# Patient Record
Sex: Female | Born: 1980 | Race: White | Hispanic: No | Marital: Single | Smoking: Never smoker
Health system: Southern US, Community
[De-identification: ages and names within clinical notes are randomized; demographics above are authoritative.]

## PROBLEM LIST (undated history)

## (undated) DIAGNOSIS — D279 Benign neoplasm of unspecified ovary: Secondary | ICD-10-CM

## (undated) DIAGNOSIS — Z973 Presence of spectacles and contact lenses: Secondary | ICD-10-CM

## (undated) DIAGNOSIS — N83299 Other ovarian cyst, unspecified side: Secondary | ICD-10-CM

## (undated) HISTORY — PX: WISDOM TOOTH EXTRACTION: SHX21

---

## 2005-04-22 ENCOUNTER — Encounter: Admission: RE | Admit: 2005-04-22 | Discharge: 2005-04-22 | Payer: Self-pay | Admitting: Family Medicine

## 2005-11-29 ENCOUNTER — Other Ambulatory Visit: Admission: RE | Admit: 2005-11-29 | Discharge: 2005-11-29 | Payer: Self-pay | Admitting: Obstetrics and Gynecology

## 2006-12-02 ENCOUNTER — Other Ambulatory Visit: Admission: RE | Admit: 2006-12-02 | Discharge: 2006-12-02 | Payer: Self-pay | Admitting: Obstetrics and Gynecology

## 2007-12-03 ENCOUNTER — Other Ambulatory Visit: Admission: RE | Admit: 2007-12-03 | Discharge: 2007-12-03 | Payer: Self-pay | Admitting: Obstetrics and Gynecology

## 2009-01-10 ENCOUNTER — Other Ambulatory Visit: Admission: RE | Admit: 2009-01-10 | Discharge: 2009-01-10 | Payer: Self-pay | Admitting: Obstetrics and Gynecology

## 2010-03-15 ENCOUNTER — Other Ambulatory Visit: Admission: RE | Admit: 2010-03-15 | Discharge: 2010-03-15 | Payer: Self-pay | Admitting: Obstetrics and Gynecology

## 2010-05-27 NOTE — L&D Delivery Note (Signed)
Delivery Note At 10:07 AM a viable female was delivered via Vaginal, Spontaneous Delivery (Presentation: ; Occiput Anterior).  APGAR: 9, 9; weight 7 lb 9.9 oz (3455 g).   Placenta status: Intact, Spontaneous.  Cord: 3 vessels with the following complications: None.  Cord pH: NA  Anesthesia: Local  Episiotomy: None Lacerations: 2nd degree;Perineal;Labial Suture Repair: 3.0 vicryl Est. Blood Loss (mL): 300 cc  Mom to postpartum.  Baby to nursery-stable.  Shyvonne Chastang J. 02/25/2011, 10:36 AM

## 2010-07-02 LAB — GC/CHLAMYDIA PROBE AMP, GENITAL
Chlamydia: NEGATIVE
Gonorrhea: NEGATIVE

## 2010-07-02 LAB — HIV ANTIBODY (ROUTINE TESTING W REFLEX): HIV: NONREACTIVE

## 2010-07-02 LAB — ABO/RH: RH Type: POSITIVE

## 2010-07-02 LAB — RUBELLA ANTIBODY, IGM: Rubella: IMMUNE

## 2010-07-02 LAB — ANTIBODY SCREEN: Antibody Screen: NEGATIVE

## 2010-09-06 ENCOUNTER — Inpatient Hospital Stay (HOSPITAL_COMMUNITY): Admission: AD | Admit: 2010-09-06 | Payer: Self-pay | Source: Ambulatory Visit | Admitting: Obstetrics and Gynecology

## 2011-01-24 LAB — STREP B DNA PROBE: GBS: NEGATIVE

## 2011-02-07 ENCOUNTER — Other Ambulatory Visit: Payer: Self-pay | Admitting: Obstetrics and Gynecology

## 2011-02-07 DIAGNOSIS — O2690 Pregnancy related conditions, unspecified, unspecified trimester: Secondary | ICD-10-CM

## 2011-02-07 DIAGNOSIS — IMO0002 Reserved for concepts with insufficient information to code with codable children: Secondary | ICD-10-CM

## 2011-02-08 ENCOUNTER — Ambulatory Visit (HOSPITAL_COMMUNITY)
Admission: RE | Admit: 2011-02-08 | Discharge: 2011-02-08 | Disposition: A | Discharge status: Home / Self Care | Payer: BC Managed Care – PPO | Source: Ambulatory Visit | Attending: Obstetrics and Gynecology | Admitting: Obstetrics and Gynecology

## 2011-02-08 ENCOUNTER — Encounter (HOSPITAL_COMMUNITY): Payer: Self-pay

## 2011-02-08 ENCOUNTER — Other Ambulatory Visit: Payer: Self-pay | Admitting: Obstetrics and Gynecology

## 2011-02-08 DIAGNOSIS — IMO0002 Reserved for concepts with insufficient information to code with codable children: Secondary | ICD-10-CM

## 2011-02-08 DIAGNOSIS — O368390 Maternal care for abnormalities of the fetal heart rate or rhythm, unspecified trimester, not applicable or unspecified: Secondary | ICD-10-CM | POA: Insufficient documentation

## 2011-02-08 DIAGNOSIS — Z3689 Encounter for other specified antenatal screening: Secondary | ICD-10-CM | POA: Insufficient documentation

## 2011-02-08 DIAGNOSIS — O2690 Pregnancy related conditions, unspecified, unspecified trimester: Secondary | ICD-10-CM

## 2011-02-08 NOTE — Progress Notes (Signed)
Encounter addended by: Marlana Latus, RN on: 02/08/2011 12:01 PM<BR>     Documentation filed: Chief Complaint Section, Episodes

## 2011-02-08 NOTE — Progress Notes (Signed)
Patient seen for ultrasound only appointment today.  Please see [facility]-OBGYN report for details.  

## 2011-02-19 ENCOUNTER — Telehealth (HOSPITAL_COMMUNITY): Payer: Self-pay | Admitting: *Deleted

## 2011-02-19 NOTE — Telephone Encounter (Signed)
Preadmission screen  

## 2011-02-20 ENCOUNTER — Inpatient Hospital Stay (HOSPITAL_COMMUNITY): Admission: AD | Admit: 2011-02-20 | Payer: Self-pay | Source: Ambulatory Visit | Admitting: Obstetrics and Gynecology

## 2011-02-21 ENCOUNTER — Encounter (HOSPITAL_COMMUNITY): Payer: Self-pay | Admitting: *Deleted

## 2011-02-24 ENCOUNTER — Encounter (HOSPITAL_COMMUNITY): Payer: Self-pay

## 2011-02-24 ENCOUNTER — Inpatient Hospital Stay (HOSPITAL_COMMUNITY)
Admission: RE | Admit: 2011-02-24 | Discharge: 2011-02-27 | DRG: 373 | Disposition: A | Discharge status: Home / Self Care | Payer: BC Managed Care – PPO | Source: Ambulatory Visit | Attending: Obstetrics and Gynecology | Admitting: Obstetrics and Gynecology

## 2011-02-24 ENCOUNTER — Other Ambulatory Visit: Payer: Self-pay | Admitting: Obstetrics and Gynecology

## 2011-02-24 DIAGNOSIS — O480 Post-term pregnancy: Principal | ICD-10-CM | POA: Diagnosis present

## 2011-02-24 LAB — CBC
MCH: 30.8 pg (ref 26.0–34.0)
MCHC: 33.9 g/dL (ref 30.0–36.0)
Platelets: 175 10*3/uL (ref 150–400)

## 2011-02-24 MED ORDER — ONDANSETRON HCL 4 MG/2ML IJ SOLN
4.0000 mg | Freq: Four times a day (QID) | INTRAMUSCULAR | Status: DC | PRN
  Administered 2011-02-25: 4 mg via INTRAVENOUS
  Filled 2011-02-24: qty 2

## 2011-02-24 MED ORDER — OXYTOCIN BOLUS FROM INFUSION
500.0000 mL | Freq: Once | INTRAVENOUS | Status: DC
  Filled 2011-02-24: qty 500
  Filled 2011-02-24: qty 1000

## 2011-02-24 MED ORDER — IBUPROFEN 600 MG PO TABS: 600.0000 mg | ORAL_TABLET | Freq: Four times a day (QID) | ORAL | Status: DC | PRN

## 2011-02-24 MED ORDER — MISOPROSTOL 25 MCG QUARTER TABLET
25.0000 ug | ORAL_TABLET | ORAL | Status: DC | PRN
  Administered 2011-02-24 – 2011-02-25 (×2): 25 ug via VAGINAL
  Filled 2011-02-24 (×2): qty 0.25
  Filled 2011-02-24: qty 1

## 2011-02-24 MED ORDER — ZOLPIDEM TARTRATE 10 MG PO TABS: 10.0000 mg | ORAL_TABLET | Freq: Every evening | ORAL | Status: DC | PRN

## 2011-02-24 MED ORDER — OXYTOCIN 20 UNITS IN LACTATED RINGERS INFUSION - SIMPLE
125.0000 mL/h | Freq: Once | INTRAVENOUS | Status: AC
  Administered 2011-02-25: 999 mL/h via INTRAVENOUS

## 2011-02-24 MED ORDER — ACETAMINOPHEN 325 MG PO TABS: 650.0000 mg | ORAL_TABLET | ORAL | Status: DC | PRN

## 2011-02-24 MED ORDER — TERBUTALINE SULFATE 1 MG/ML IJ SOLN: 0.2500 mg | Freq: Once | INTRAMUSCULAR | Status: AC | PRN

## 2011-02-24 MED ORDER — LACTATED RINGERS IV SOLN: 500.0000 mL | INTRAVENOUS | Status: DC | PRN

## 2011-02-24 MED ORDER — CITRIC ACID-SODIUM CITRATE 334-500 MG/5ML PO SOLN: 30.0000 mL | ORAL | Status: DC | PRN

## 2011-02-24 MED ORDER — OXYCODONE-ACETAMINOPHEN 5-325 MG PO TABS: 2.0000 | ORAL_TABLET | ORAL | Status: DC | PRN

## 2011-02-24 MED ORDER — LIDOCAINE HCL (PF) 1 % IJ SOLN
30.0000 mL | INTRAMUSCULAR | Status: AC | PRN
  Administered 2011-02-25: 30 mL via SUBCUTANEOUS
  Filled 2011-02-24: qty 30

## 2011-02-24 MED ORDER — BUTORPHANOL TARTRATE 2 MG/ML IJ SOLN
1.0000 mg | INTRAMUSCULAR | Status: DC | PRN
  Administered 2011-02-25: 1 mg via INTRAVENOUS
  Filled 2011-02-24: qty 1

## 2011-02-24 MED ORDER — LACTATED RINGERS IV SOLN
INTRAVENOUS | Status: DC
  Administered 2011-02-24: 21:00:00 via INTRAVENOUS

## 2011-02-24 NOTE — Progress Notes (Signed)
Notified MD of pt arrival. Orders received.

## 2011-02-25 ENCOUNTER — Encounter (HOSPITAL_COMMUNITY): Payer: Self-pay

## 2011-02-25 LAB — RPR: RPR Ser Ql: NONREACTIVE

## 2011-02-25 LAB — ABO/RH: ABO/RH(D): O POS

## 2011-02-25 MED ORDER — DIBUCAINE 1 % RE OINT: 1.0000 "application " | TOPICAL_OINTMENT | RECTAL | Status: DC | PRN

## 2011-02-25 MED ORDER — OXYCODONE-ACETAMINOPHEN 5-325 MG PO TABS: 1.0000 | ORAL_TABLET | ORAL | Status: DC | PRN

## 2011-02-25 MED ORDER — SIMETHICONE 80 MG PO CHEW: 80.0000 mg | CHEWABLE_TABLET | ORAL | Status: DC | PRN

## 2011-02-25 MED ORDER — BENZOCAINE-MENTHOL 20-0.5 % EX AERO
1.0000 "application " | INHALATION_SPRAY | CUTANEOUS | Status: DC | PRN
  Administered 2011-02-25: 1 via TOPICAL

## 2011-02-25 MED ORDER — METHYLERGONOVINE MALEATE 0.2 MG PO TABS: 0.2000 mg | ORAL_TABLET | ORAL | Status: DC | PRN

## 2011-02-25 MED ORDER — SENNOSIDES-DOCUSATE SODIUM 8.6-50 MG PO TABS
2.0000 | ORAL_TABLET | Freq: Every day | ORAL | Status: DC
  Administered 2011-02-25 – 2011-02-26 (×2): 2 via ORAL

## 2011-02-25 MED ORDER — FENTANYL 2.5 MCG/ML BUPIVACAINE 1/10 % EPIDURAL INFUSION (WH - ANES): 14.0000 mL/h | INTRAMUSCULAR | Status: DC

## 2011-02-25 MED ORDER — METHYLERGONOVINE MALEATE 0.2 MG/ML IJ SOLN: 0.2000 mg | INTRAMUSCULAR | Status: DC | PRN

## 2011-02-25 MED ORDER — DIPHENHYDRAMINE HCL 25 MG PO CAPS: 25.0000 mg | ORAL_CAPSULE | Freq: Four times a day (QID) | ORAL | Status: DC | PRN

## 2011-02-25 MED ORDER — EPHEDRINE 5 MG/ML INJ
10.0000 mg | INTRAVENOUS | Status: DC | PRN
  Filled 2011-02-25: qty 4

## 2011-02-25 MED ORDER — PRENATAL PLUS 27-1 MG PO TABS
1.0000 | ORAL_TABLET | Freq: Every day | ORAL | Status: DC
  Administered 2011-02-26 – 2011-02-27 (×2): 1 via ORAL
  Filled 2011-02-25 (×2): qty 1

## 2011-02-25 MED ORDER — WITCH HAZEL-GLYCERIN EX PADS: 1.0000 "application " | MEDICATED_PAD | CUTANEOUS | Status: DC | PRN

## 2011-02-25 MED ORDER — PRENATAL PLUS 27-1 MG PO TABS: 1.0000 | ORAL_TABLET | Freq: Every day | ORAL | Status: DC

## 2011-02-25 MED ORDER — PHENYLEPHRINE 40 MCG/ML (10ML) SYRINGE FOR IV PUSH (FOR BLOOD PRESSURE SUPPORT)
80.0000 ug | PREFILLED_SYRINGE | INTRAVENOUS | Status: DC | PRN
  Filled 2011-02-25: qty 5

## 2011-02-25 MED ORDER — LACTATED RINGERS IV SOLN: 500.0000 mL | Freq: Once | INTRAVENOUS | Status: DC

## 2011-02-25 MED ORDER — LANOLIN HYDROUS EX OINT: TOPICAL_OINTMENT | CUTANEOUS | Status: DC | PRN

## 2011-02-25 MED ORDER — DIPHENHYDRAMINE HCL 50 MG/ML IJ SOLN: 12.5000 mg | INTRAMUSCULAR | Status: DC | PRN

## 2011-02-25 MED ORDER — IBUPROFEN 600 MG PO TABS
600.0000 mg | ORAL_TABLET | Freq: Four times a day (QID) | ORAL | Status: DC
  Administered 2011-02-25 – 2011-02-27 (×8): 600 mg via ORAL
  Filled 2011-02-25 (×8): qty 1

## 2011-02-25 MED ORDER — ZOLPIDEM TARTRATE 5 MG PO TABS: 5.0000 mg | ORAL_TABLET | Freq: Every evening | ORAL | Status: DC | PRN

## 2011-02-25 MED ORDER — ONDANSETRON HCL 4 MG PO TABS: 4.0000 mg | ORAL_TABLET | ORAL | Status: DC | PRN

## 2011-02-25 MED ORDER — ONDANSETRON HCL 4 MG/2ML IJ SOLN: 4.0000 mg | INTRAMUSCULAR | Status: DC | PRN

## 2011-02-25 MED ORDER — BENZOCAINE-MENTHOL 20-0.5 % EX AERO
INHALATION_SPRAY | CUTANEOUS | Status: AC
  Administered 2011-02-25: 1 via TOPICAL
  Filled 2011-02-25: qty 56

## 2011-02-25 NOTE — Progress Notes (Signed)
UR chart review completed.  

## 2011-02-25 NOTE — H&P (Signed)
MEEYAH OVITT is a 30 y.o. female presenting for induction due to post dates at 40 wks and 4 days. + FM ROM at 600 am clear fluid... No vaginal bleeding. Ctx rated as 5 out of 10. . @IPILAPH @ OB History    Grav Para Term Preterm Abortions TAB SAB Ect Mult Living   1              Past Medical History  Diagnosis Date  . No pertinent past medical history    Past Surgical History  Procedure Date  . Mouth surgery    Family History: family history includes Cancer in her maternal grandmother; Diabetes in her father; and Hyperlipidemia in her father. Social History:  reports that she has never smoked. She does not have any smokeless tobacco history on file. She reports that she does not drink alcohol or use illicit drugs.  MEds PNV Allergies nkda ... No latex allergy    Dilation: 4 Effacement (%): 80 Station: -2 Exam by:: Chief Technology Officer Blood pressure 116/71, pulse 60, temperature 98 F (36.7 C), temperature source Oral, resp. rate 20, height 5\' 8"  (1.727 m), weight 86.183 kg (190 lb). CV rrr  Lungs clear abd gravid nontender Ext Trace edema Cx. 6/80/-2 fhr baseline 120 good btbv +accels no decels  toco ctx q 2 minutes   Prenatal labs: ABO, Rh: O/Positive/-- (02/06 0000) Antibody: Negative (02/06 0000) Rubella:   RPR: NON REACTIVE (09/30 2035)  HBsAg: Negative (02/06 0000)  HIV: Non-reactive (02/06 0000)  GBS: Negative (08/30 0000)   Assessment/Plan: 40 wks 5 days induction for post dates Anticipate svd    Serine Kea J. 02/25/2011, 8:25 AM

## 2011-02-26 LAB — CBC
MCH: 30.4 pg (ref 26.0–34.0)
MCHC: 33.2 g/dL (ref 30.0–36.0)
Platelets: 165 10*3/uL (ref 150–400)
RBC: 3.59 MIL/uL — ABNORMAL LOW (ref 3.87–5.11)

## 2011-02-26 MED ORDER — INFLUENZA VIRUS VACC SPLIT PF IM SUSP
0.5000 mL | Freq: Once | INTRAMUSCULAR | Status: AC
  Administered 2011-02-26: 0.5 mL via INTRAMUSCULAR
  Filled 2011-02-26: qty 0.5

## 2011-02-26 MED ORDER — TETANUS-DIPHTH-ACELL PERTUSSIS 5-2.5-18.5 LF-MCG/0.5 IM SUSP
0.5000 mL | Freq: Once | INTRAMUSCULAR | Status: AC
  Administered 2011-02-26: 0.5 mL via INTRAMUSCULAR
  Filled 2011-02-26: qty 0.5

## 2011-02-26 NOTE — Progress Notes (Signed)
Post Partum Day 1 Subjective: no complaints, up ad lib, voiding, tolerating PO and + flatus  Objective: Blood pressure 112/78, pulse 62, temperature 97.8 F (36.6 C), temperature source Oral, resp. rate 18, height 5\' 8"  (1.727 m), weight 86.183 kg (190 lb), unknown if currently breastfeeding.  Physical Exam:  General: alert and cooperative Lochia: appropriate Uterine Fundus: firm Incision: na DVT Evaluation: No evidence of DVT seen on physical exam.   Basename 02/26/11 0600 02/24/11 2035  HGB 10.9* 12.2  HCT 32.8* 36.0    Assessment/Plan: Plan for discharge tomorrow   LOS: 2 days   Heliodoro Domagalski J. 02/26/2011, 8:39 AM

## 2011-02-27 MED ORDER — IBUPROFEN 600 MG PO TABS: 600.0000 mg | ORAL_TABLET | Freq: Four times a day (QID) | ORAL | Status: AC | PRN

## 2011-02-27 NOTE — Progress Notes (Signed)
Post Partum Day 2 Subjective: no complaints, up ad lib, voiding, tolerating PO and + flatus  Objective: Blood pressure 99/67, pulse 65, temperature 98 F (36.7 C), temperature source Oral, resp. rate 18, height 5\' 8"  (1.727 m), weight 86.183 kg (190 lb), unknown if currently breastfeeding.  Physical Exam:  General: alert and cooperative Lochia: appropriate Uterine Fundus: firm Incision: na DVT Evaluation: No evidence of DVT seen on physical exam.   Basename 02/26/11 0600 02/24/11 2035  HGB 10.9* 12.2  HCT 32.8* 36.0    Assessment/Plan: Discharge home and Breastfeeding   LOS: 3 days   Georgeann Brinkman J. 02/27/2011, 9:04 AM

## 2011-02-27 NOTE — Discharge Summary (Signed)
Obstetric Discharge Summary Reason for Admission: induction of labor Prenatal Procedures: none Intrapartum Procedures: spontaneous vaginal delivery Postpartum Procedures: none Complications-Operative and Postpartum: none Hemoglobin  Date Value Range Status  02/26/2011 10.9* 12.0-15.0 (g/dL) Final     HCT  Date Value Range Status  02/26/2011 32.8* 36.0-46.0 (%) Final    Discharge Diagnoses: Term Pregnancy-delivered  Discharge Information: Date: 02/27/2011 Activity: pelvic rest Diet: routine Medications: PNV and Ibuprofen Condition: stable Instructions: refer to practice specific booklet Discharge to: home Follow-up Information    Follow up with Jahari Billy J.. Make an appointment in 6 weeks.   Contact information:   [address] [place] [address] [phone]          Newborn Data: Live born female  Birth Weight: 7 lb 9.9 oz (3455 g) APGAR: 9, 9  Home with mother.  Keygan Dumond J. 02/27/2011, 9:07 AM

## 2011-06-06 ENCOUNTER — Other Ambulatory Visit (HOSPITAL_COMMUNITY)
Admission: RE | Admit: 2011-06-06 | Discharge: 2011-06-06 | Disposition: A | Discharge status: Home / Self Care | Payer: BC Managed Care – PPO | Source: Ambulatory Visit | Attending: Obstetrics and Gynecology | Admitting: Obstetrics and Gynecology

## 2011-06-06 ENCOUNTER — Other Ambulatory Visit: Payer: Self-pay | Admitting: Obstetrics and Gynecology

## 2011-06-06 DIAGNOSIS — Z01419 Encounter for gynecological examination (general) (routine) without abnormal findings: Secondary | ICD-10-CM | POA: Insufficient documentation

## 2013-02-14 IMAGING — US US OB COMP +14 WK
2 series · 14 of 28 positions shown · non-contrast
Comparison: none

[Series 1: us ob comp +14 wk · 13 of 58 slices shown (1 of 2)]
[im 3/58]
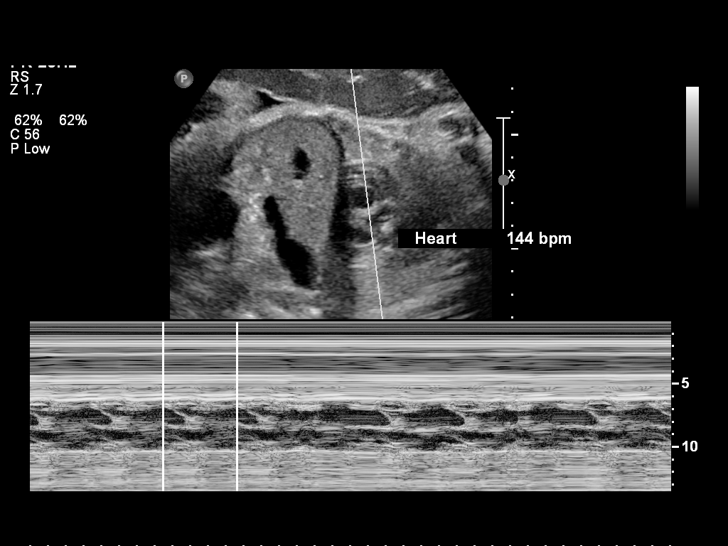
[im 7/58]
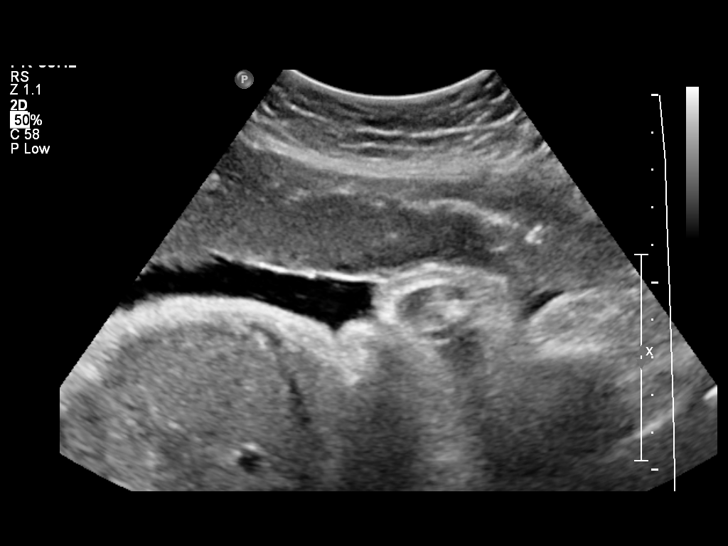
[im 11/58]
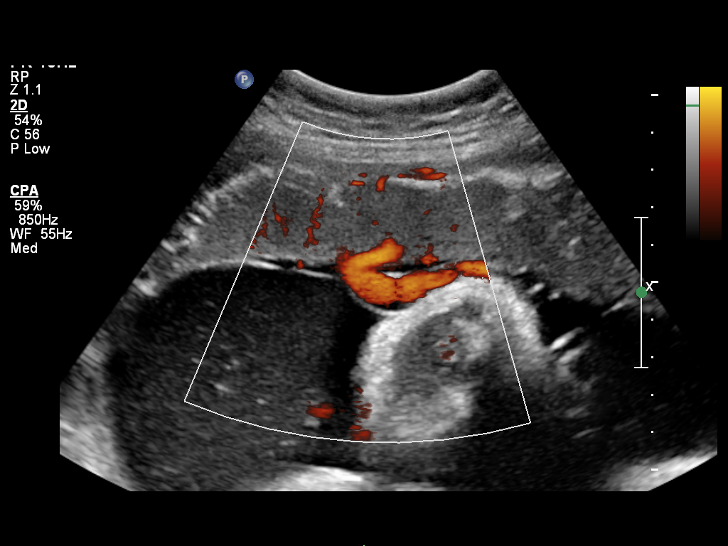
[im 16/58]
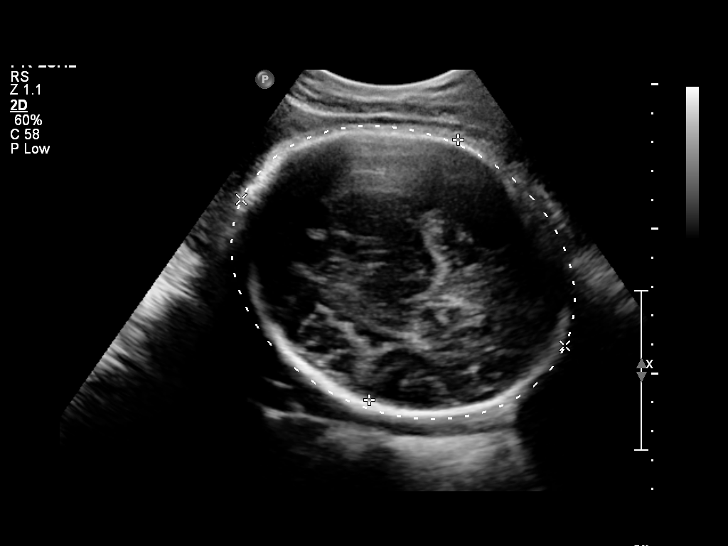
[im 20/58]
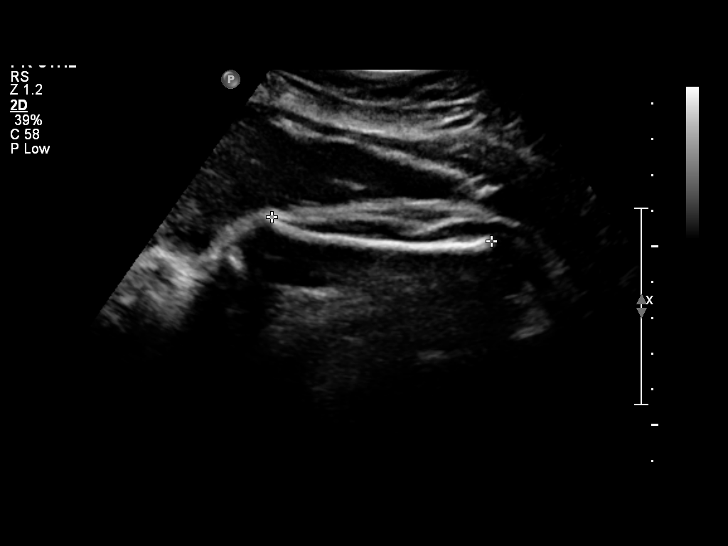
[im 25/58]
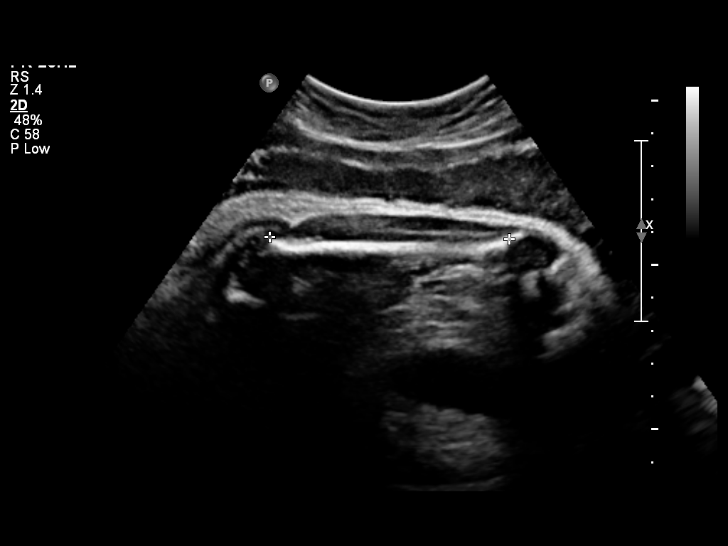
[im 29/58]
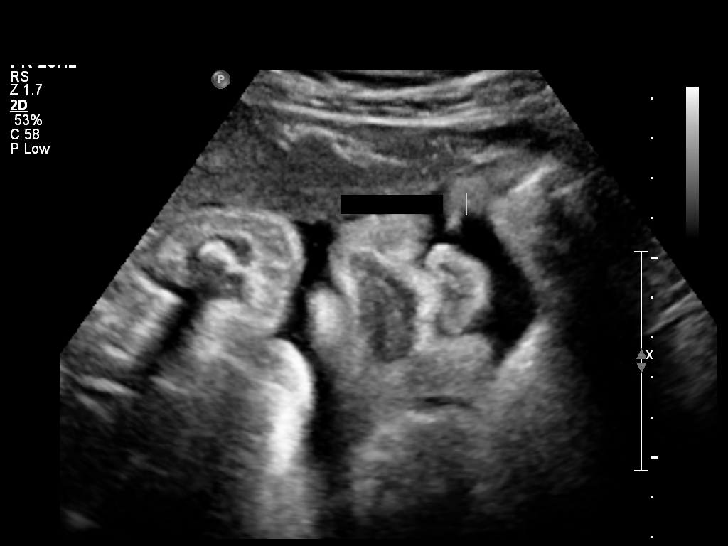
[im 33/58]
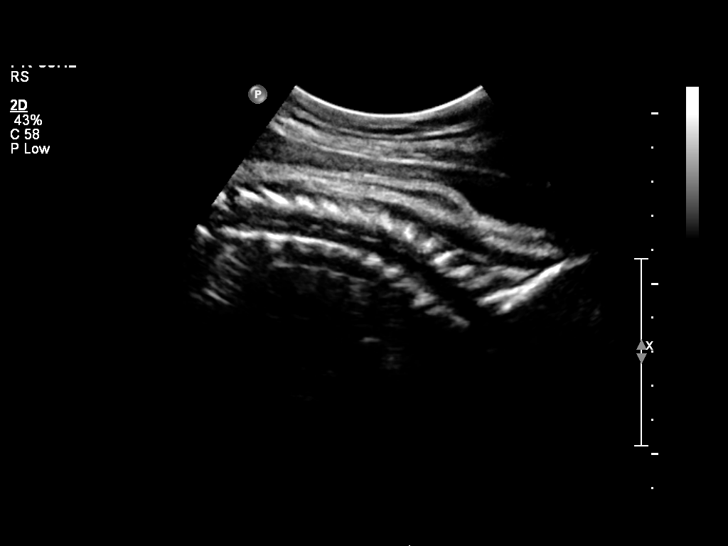
[im 38/58]
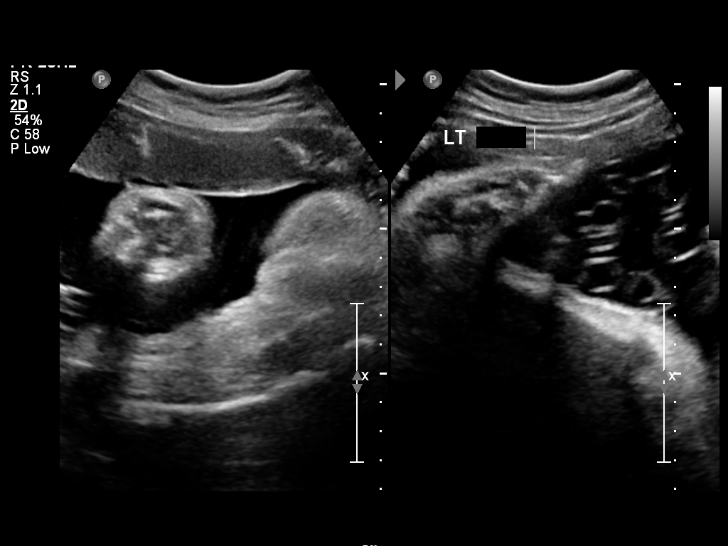
[im 42/58]
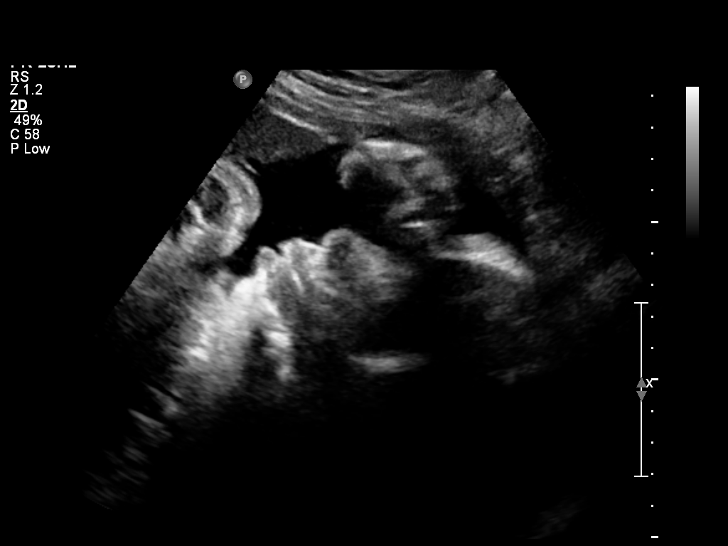
[im 47/58]
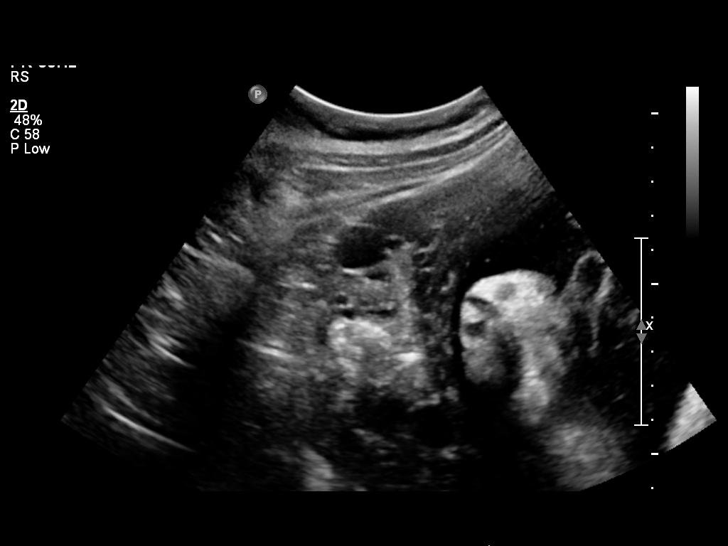
[im 51/58]
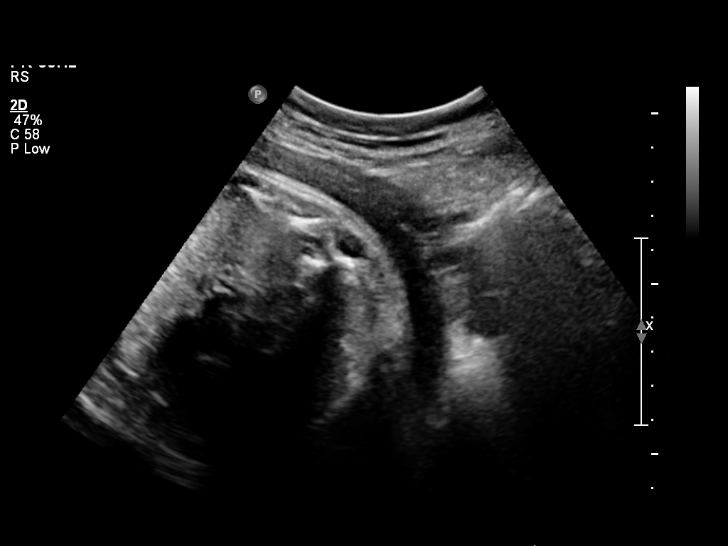
[im 55/58]
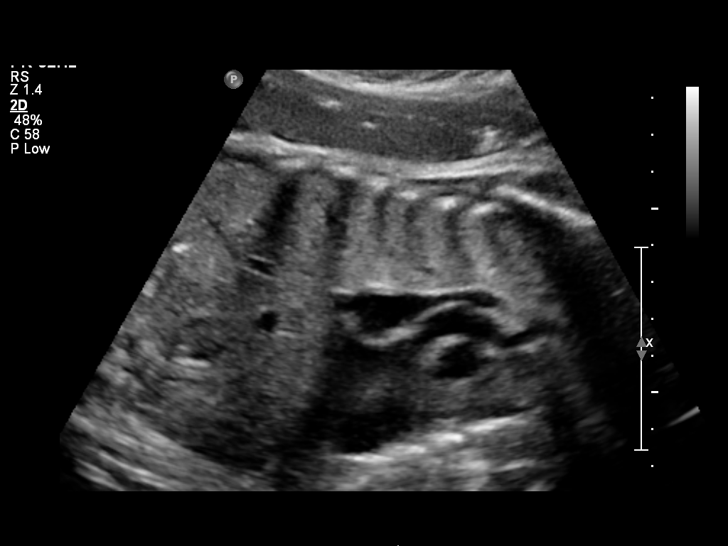

[Series 1: us ob comp +14 wk · 1 of 2 slices shown (2 of 2)]
[im 1/2]
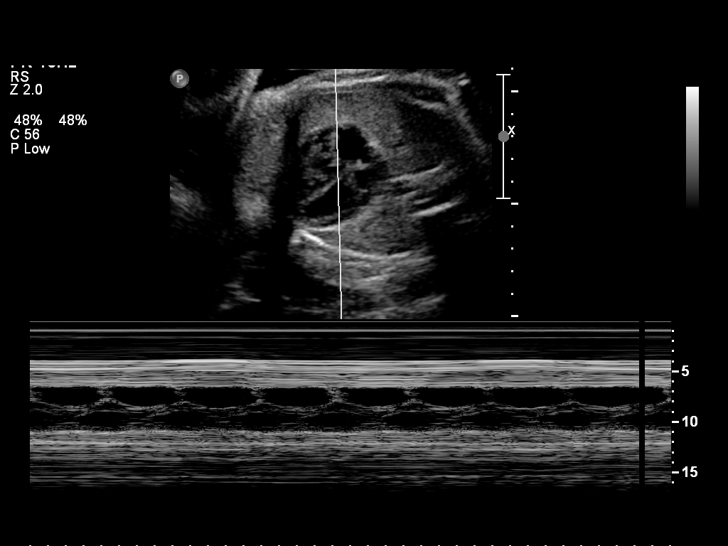

[14 of 28 positions shown; findings below may reference images not displayed]

Canned report from images found in remote index.

Refer to host system for actual result text.

## 2014-03-28 ENCOUNTER — Encounter (HOSPITAL_COMMUNITY): Payer: Self-pay

## 2014-10-26 ENCOUNTER — Other Ambulatory Visit (HOSPITAL_COMMUNITY)
Admission: RE | Admit: 2014-10-26 | Discharge: 2014-10-26 | Disposition: A | Discharge status: Home / Self Care | Payer: Managed Care, Other (non HMO) | Source: Ambulatory Visit | Attending: Emergency Medicine | Admitting: Emergency Medicine

## 2014-10-26 ENCOUNTER — Emergency Department (HOSPITAL_COMMUNITY)
Admission: EM | Admit: 2014-10-26 | Discharge: 2014-10-26 | Disposition: A | Discharge status: Home / Self Care | Payer: Managed Care, Other (non HMO) | Source: Home / Self Care | Attending: Emergency Medicine | Admitting: Emergency Medicine

## 2014-10-26 ENCOUNTER — Encounter (HOSPITAL_COMMUNITY): Payer: Self-pay | Admitting: Emergency Medicine

## 2014-10-26 DIAGNOSIS — N898 Other specified noninflammatory disorders of vagina: Secondary | ICD-10-CM | POA: Diagnosis not present

## 2014-10-26 DIAGNOSIS — N760 Acute vaginitis: Secondary | ICD-10-CM | POA: Diagnosis present

## 2014-10-26 MED ORDER — HYDROCORTISONE 2.5 % EX CREA: TOPICAL_CREAM | Freq: Two times a day (BID) | CUTANEOUS | Status: DC

## 2014-10-26 NOTE — ED Provider Notes (Signed)
CSN: [id number]     Arrival date & time 10/26/14  1001 History   First MD Initiated Contact with Patient 10/26/14 1053     Chief Complaint  Patient presents with  . Vaginal Discharge   (Consider location/radiation/quality/duration/timing/severity/associated sxs/prior Treatment) HPI  She is a 34 year old woman here for vaginal irritation. She states she has had irritation and discharge since the middle of March. She has been seen by her OB/GYN is tested her for gonorrhea and Chlamydia. This testing was negative. She has also been treated with oral Flagyl, oral Diflucan, MetroGel, and topical anti-fungals.  She states it seemed to get better. Yesterday, she did not have any symptoms. Today, the burning is back. While she had itching initially, this has since resolved. She reports an intense burning sensation, primarily around the inner labia. She denies any vaginal discharge. No new sexual partners.  Past Medical History  Diagnosis Date  . No pertinent past medical history    Past Surgical History  Procedure Laterality Date  . Mouth surgery     Family History  Problem Relation Age of Onset  . Diabetes Father   . Hyperlipidemia Father   . Cancer Maternal Grandmother     breast   History  Substance Use Topics  . Smoking status: Never Smoker   . Smokeless tobacco: Not on file  . Alcohol Use: No   OB History    Gravida Para Term Preterm AB TAB SAB Ectopic Multiple Living   1 1 1       1      Review of Systems As in history of present illness Allergies  Review of patient's allergies indicates no known allergies.  Home Medications   Prior to Admission medications   Medication Sig Start Date End Date Taking? Authorizing Provider  hydrocortisone 2.5 % cream Apply topically 2 (two) times daily. For 1 week 10/26/14   Melony Overly, MD  prenatal vitamin w/FE, FA (PRENATAL 1 + 1) 27-1 MG TABS Take 1 tablet by mouth daily.      Historical Provider, MD  PRENATAL VITAMINS PO Take by mouth.       Historical Provider, MD   BP 115/86 mmHg  Pulse 60  Temp(Src) 99 F (37.2 C) (Oral)  Resp 16  SpO2 100%  LMP 10/08/2014 Physical Exam  Constitutional: She is oriented to person, place, and time. She appears well-developed and well-nourished. No distress.  Cardiovascular: Normal rate.   Genitourinary:    Cervix exhibits no motion tenderness and no discharge. No foreign body around the vagina. No vaginal discharge found.  Neurological: She is alert and oriented to person, place, and time.    ED Course  Procedures (including critical care time) Labs Review Labs Reviewed - No data to display  Imaging Review No results found.   MDM   1. Vaginal irritation    We'll treat with hydrocortisone 2.5% twice a day for the next week. Wet prep collected. STD testing not indicated. She will follow-up with her OB/GYN next week as scheduled.    Melony Overly, MD 10/26/14 1118

## 2014-10-26 NOTE — ED Notes (Signed)
Co vaginal discharge since March Has been using OTC meds

## 2014-10-26 NOTE — Discharge Instructions (Signed)
Your skin is irritated. Apply hydrocortisone cream twice a day for the next week. Do not put this inside the vagina. It does not look like any infection. Follow-up with your regular doctor next week as scheduled.

## 2014-10-27 LAB — CERVICOVAGINAL ANCILLARY ONLY: WET PREP (BD AFFIRM): POSITIVE — AB

## 2014-11-01 NOTE — ED Notes (Signed)
No treatment per Dr Bridgett Larsson

## 2015-11-03 ENCOUNTER — Emergency Department (HOSPITAL_COMMUNITY)
Admission: EM | Admit: 2015-11-03 | Discharge: 2015-11-04 | Disposition: A | Discharge status: Home / Self Care | Payer: BC Managed Care – PPO | Attending: Emergency Medicine | Admitting: Emergency Medicine

## 2015-11-03 ENCOUNTER — Encounter (HOSPITAL_COMMUNITY): Payer: Self-pay | Admitting: Emergency Medicine

## 2015-11-03 DIAGNOSIS — R102 Pelvic and perineal pain: Secondary | ICD-10-CM

## 2015-11-03 DIAGNOSIS — N838 Other noninflammatory disorders of ovary, fallopian tube and broad ligament: Secondary | ICD-10-CM

## 2015-11-03 DIAGNOSIS — N83202 Unspecified ovarian cyst, left side: Secondary | ICD-10-CM | POA: Diagnosis not present

## 2015-11-03 LAB — COMPREHENSIVE METABOLIC PANEL
ALBUMIN: 4.4 g/dL (ref 3.5–5.0)
ALK PHOS: 55 U/L (ref 38–126)
ALT: 22 U/L (ref 14–54)
AST: 24 U/L (ref 15–41)
Anion gap: 8 (ref 5–15)
BILIRUBIN TOTAL: 0.5 mg/dL (ref 0.3–1.2)
BUN: 7 mg/dL (ref 6–20)
CO2: 26 mmol/L (ref 22–32)
Calcium: 9.2 mg/dL (ref 8.9–10.3)
Chloride: 108 mmol/L (ref 101–111)
Creatinine, Ser: 0.69 mg/dL (ref 0.44–1.00)
GFR calc Af Amer: >60 mL/min (ref >60)
GFR calc non Af Amer: >60 mL/min (ref >60)
GLUCOSE: 104 mg/dL — AB (ref 65–99)
POTASSIUM: 3.5 mmol/L (ref 3.5–5.1)
Sodium: 142 mmol/L (ref 135–145)
TOTAL PROTEIN: 7.2 g/dL (ref 6.5–8.1)

## 2015-11-03 LAB — CBC
HCT: 41.3 % (ref 36.0–46.0)
Hemoglobin: 13.1 g/dL (ref 12.0–15.0)
MCH: 29.6 pg (ref 26.0–34.0)
MCHC: 31.7 g/dL (ref 30.0–36.0)
MCV: 93.4 fL (ref 78.0–100.0)
Platelets: 219 10*3/uL (ref 150–400)
RBC: 4.42 MIL/uL (ref 3.87–5.11)
RDW: 12.4 % (ref 11.5–15.5)
WBC: 6.7 10*3/uL (ref 4.0–10.5)

## 2015-11-03 LAB — URINALYSIS, ROUTINE W REFLEX MICROSCOPIC
BILIRUBIN URINE: NEGATIVE
Glucose, UA: NEGATIVE mg/dL
Hgb urine dipstick: NEGATIVE
Ketones, ur: NEGATIVE mg/dL
Leukocytes, UA: NEGATIVE
NITRITE: NEGATIVE
Protein, ur: NEGATIVE mg/dL
SPECIFIC GRAVITY, URINE: 1.006 (ref 1.005–1.030)
pH: 5.5 (ref 5.0–8.0)

## 2015-11-03 LAB — LIPASE, BLOOD: Lipase: 29 U/L (ref 11–51)

## 2015-11-03 MED ORDER — FENTANYL CITRATE (PF) 100 MCG/2ML IJ SOLN
50.0000 ug | INTRAMUSCULAR | Status: AC | PRN
  Administered 2015-11-03 (×2): 50 ug via NASAL
  Filled 2015-11-03: qty 2

## 2015-11-03 MED ORDER — ONDANSETRON 4 MG PO TBDP
4.0000 mg | ORAL_TABLET | Freq: Once | ORAL | Status: AC
  Administered 2015-11-03: 4 mg via ORAL

## 2015-11-03 MED ORDER — FENTANYL CITRATE (PF) 100 MCG/2ML IJ SOLN
INTRAMUSCULAR | Status: AC
  Filled 2015-11-03: qty 2

## 2015-11-03 MED ORDER — HYDROMORPHONE HCL 1 MG/ML IJ SOLN
1.0000 mg | INTRAMUSCULAR | Status: DC | PRN
  Administered 2015-11-04: 1 mg via INTRAVENOUS
  Filled 2015-11-03: qty 1

## 2015-11-03 MED ORDER — ONDANSETRON 4 MG PO TBDP
ORAL_TABLET | ORAL | Status: AC
Start: 2015-11-03 — End: 2015-11-03
  Administered 2015-11-03: 4 mg via ORAL
  Filled 2015-11-03: qty 1

## 2015-11-03 NOTE — ED Notes (Signed)
Pt. reports LLQ pain onset this evening , denies nausea or vomitting , no diarrhea or fever .

## 2015-11-03 NOTE — ED Provider Notes (Signed)
CSN: [id number]     Arrival date & time 11/03/15  2101 History   First MD Initiated Contact with Patient 11/03/15 2326     Chief Complaint  Patient presents with  . Abdominal Pain     (Consider location/radiation/quality/duration/timing/severity/associated sxs/prior Treatment) HPI Comments: Patient presents to the emergency department with chief complaint of left pelvic pain. She states that it was sudden onset several hours ago. States that it felt her over in pain and caused her to have nausea and vomiting. She denies any history of kidney stones. Denies any flank or back pain. Denies any hematuria. Denies any associated fevers or chills. She has been given fentanyl with good relief of pain.  She denies any vaginal discharge or bleeding.  The history is provided by the patient. No language interpreter was used.    Past Medical History  Diagnosis Date  . No pertinent past medical history    Past Surgical History  Procedure Laterality Date  . Mouth surgery     Family History  Problem Relation Age of Onset  . Diabetes Father   . Hyperlipidemia Father   . Cancer Maternal Grandmother     breast   Social History  Substance Use Topics  . Smoking status: Never Smoker   . Smokeless tobacco: None  . Alcohol Use: No   OB History    Gravida Para Term Preterm AB TAB SAB Ectopic Multiple Living   1 1 1       1      Review of Systems  Constitutional: Negative for fever and chills.  Respiratory: Negative for shortness of breath.   Cardiovascular: Negative for chest pain.  Gastrointestinal: Negative for nausea, vomiting, diarrhea and constipation.  Genitourinary: Positive for pelvic pain. Negative for dysuria.  All other systems reviewed and are negative.     Allergies  Review of patient's allergies indicates no known allergies.  Home Medications   Prior to Admission medications   Not on File   BP 116/91 mmHg  Pulse 94  Temp(Src) 97.8 F (36.6 C) (Oral)  Resp 18  SpO2  97%  LMP 10/06/2015 (Approximate) Physical Exam  Constitutional: She is oriented to person, place, and time. She appears well-developed and well-nourished.  HENT:  Head: Normocephalic and atraumatic.  Eyes: Conjunctivae and EOM are normal. Pupils are equal, round, and reactive to light.  Neck: Normal range of motion. Neck supple.  Cardiovascular: Normal rate and regular rhythm.  Exam reveals no gallop and no friction rub.   No murmur heard. Pulmonary/Chest: Effort normal and breath sounds normal. No respiratory distress. She has no wheezes. She has no rales. She exhibits no tenderness.  Abdominal: Soft. Bowel sounds are normal. She exhibits no distension and no mass. There is no tenderness. There is no rebound and no guarding.  Genitourinary:  Pelvic exam chaperoned by female ER tech, no right adnexal tenderness,  moderate left adnexal tenderness, no uterine tenderness, moderate white vaginal discharge, no bleeding, no CMT or friability, no foreign body, no injury to the external genitalia, no other significant findings   Musculoskeletal: Normal range of motion. She exhibits no edema or tenderness.  Neurological: She is alert and oriented to person, place, and time.  Skin: Skin is warm and dry.  Psychiatric: She has a normal mood and affect. Her behavior is normal. Judgment and thought content normal.  Nursing note and vitals reviewed.   ED Course  Procedures (including critical care time) Labs Review Labs Reviewed  COMPREHENSIVE METABOLIC PANEL - Abnormal;  Notable for the following:    Glucose, Bld 104 (*)    All other components within normal limits  WET PREP, GENITAL  LIPASE, BLOOD  CBC  URINALYSIS, ROUTINE W REFLEX MICROSCOPIC (NOT AT [facility])  PREGNANCY, URINE  POC URINE PREG, ED  GC/CHLAMYDIA PROBE AMP ([place]) NOT AT [facility]    Imaging Review No results found. I have personally reviewed and evaluated these images and lab results as part of my medical decision-making.    EKG Interpretation None      MDM   Final diagnoses:  None    Patient with left pelvic/adnexal pain. Sudden onset. No associated flank pain or hematuria. Kidney stone thought to be less likely. The pain did cause her to be doubled over in pain. It was relieved with fentanyl. Concern for ovarian torsion. Will check pelvic ultrasound.   11:30  Discussed patient with Dr. Maryan Rued, who agrees with plan.  11:40 Called [place] to see if exam could be expedited; notified that [place] is prepping room for patient.  12:57 AM [place] results pending.   Patient signed out to [place], PA-C.   Montine Circle, PA-C 11/04/15 [id number]  Blanchie Dessert, MD 11/04/15 [phone]

## 2015-11-04 ENCOUNTER — Emergency Department (HOSPITAL_COMMUNITY): Payer: BC Managed Care – PPO

## 2015-11-04 LAB — WET PREP, GENITAL
Sperm: NONE SEEN
Trich, Wet Prep: NONE SEEN
YEAST WET PREP: NONE SEEN

## 2015-11-04 LAB — PREGNANCY, URINE: Preg Test, Ur: NEGATIVE

## 2015-11-04 MED ORDER — ONDANSETRON 4 MG PO TBDP: ORAL_TABLET | ORAL | Status: DC

## 2015-11-04 MED ORDER — OXYCODONE HCL 5 MG PO TABS: 5.0000 mg | ORAL_TABLET | Freq: Four times a day (QID) | ORAL | Status: DC | PRN

## 2015-11-04 MED ORDER — HYDROMORPHONE HCL 1 MG/ML IJ SOLN
1.0000 mg | Freq: Once | INTRAMUSCULAR | Status: AC
Start: 2015-11-04 — End: 2015-11-04
  Administered 2015-11-04: 1 mg via INTRAVENOUS
  Filled 2015-11-04: qty 1

## 2015-11-04 NOTE — Discharge Instructions (Signed)
1. Medications: roxicodone, tylenol 1000mg  every 6 hours, , usual home medications 2. Treatment: rest, drink plenty of fluids, 3. Follow Up: Please followup with OB/GYN in  days for discussion of your diagnoses and further evaluation after today's visit; if you do not have a primary care doctor use the resource guide provided to find one; Please return to the ER for worsening pain, other concerning symptoms

## 2015-11-04 NOTE — ED Notes (Signed)
Discharge instructions and prescriptions reviewed - voiced understanding 

## 2015-11-04 NOTE — ED Provider Notes (Signed)
Care assumed from [employer], [place].  Judy Larson is a 35 y.o. female presents with sudden onset LLQ abd pain earlier today.  Labs are reassuring and no evidence of urinary tract infection. Negative pregnancy test. Pelvic exam performed by initial provider revealed significant left adnexal tenderness.  She has required multiple doses of Dilaudid to control her pain.    Physical Exam  BP 102/73 mmHg  Pulse 83  Temp(Src) 97.8 F (36.6 C) (Oral)  Resp 18  SpO2 99%  LMP 10/06/2015 (Approximate)  Physical Exam   Face to face Exam:   General: Awake  HEENT: Atraumatic  Resp: Normal effort  Abd: Nondistended  Neuro:No focal weakness  Psyc: Normal affect  Results for orders placed or performed during the hospital encounter of 11/03/15  Wet prep, genital  Result Value Ref Range   Yeast Wet Prep HPF POC NONE SEEN NONE SEEN   Trich, Wet Prep NONE SEEN NONE SEEN   Clue Cells Wet Prep HPF POC PRESENT (A) NONE SEEN   WBC, Wet Prep HPF POC FEW (A) NONE SEEN   Sperm NONE SEEN   Lipase, blood  Result Value Ref Range   Lipase 29 11 - 51 U/L  Comprehensive metabolic panel  Result Value Ref Range   Sodium 142 135 - 145 mmol/L   Potassium 3.5 3.5 - 5.1 mmol/L   Chloride 108 101 - 111 mmol/L   CO2 26 22 - 32 mmol/L   Glucose, Bld 104 (H) 65 - 99 mg/dL   BUN 7 6 - 20 mg/dL   Creatinine, Ser 0.69 0.44 - 1.00 mg/dL   Calcium 9.2 8.9 - 10.3 mg/dL   Total Protein 7.2 6.5 - 8.1 g/dL   Albumin 4.4 3.5 - 5.0 g/dL   AST 24 15 - 41 U/L   ALT 22 14 - 54 U/L   Alkaline Phosphatase 55 38 - 126 U/L   Total Bilirubin 0.5 0.3 - 1.2 mg/dL   GFR calc non Af Amer >60 >60 mL/min   GFR calc Af Amer >60 >60 mL/min   Anion gap 8 5 - 15  CBC  Result Value Ref Range   WBC 6.7 4.0 - 10.5 K/uL   RBC 4.42 3.87 - 5.11 MIL/uL   Hemoglobin 13.1 12.0 - 15.0 g/dL   HCT 41.3 36.0 - 46.0 %   MCV 93.4 78.0 - 100.0 fL   MCH 29.6 26.0 - 34.0 pg   MCHC 31.7 30.0 - 36.0 g/dL   RDW 12.4 11.5 - 15.5 %    Platelets 219 150 - 400 K/uL  Urinalysis, Routine w reflex microscopic  Result Value Ref Range   Color, Urine YELLOW YELLOW   APPearance CLEAR CLEAR   Specific Gravity, Urine 1.006 1.005 - 1.030   pH 5.5 5.0 - 8.0   Glucose, UA NEGATIVE NEGATIVE mg/dL   Hgb urine dipstick NEGATIVE NEGATIVE   Bilirubin Urine NEGATIVE NEGATIVE   Ketones, ur NEGATIVE NEGATIVE mg/dL   Protein, ur NEGATIVE NEGATIVE mg/dL   Nitrite NEGATIVE NEGATIVE   Leukocytes, UA NEGATIVE NEGATIVE  Pregnancy, urine  Result Value Ref Range   Preg Test, Ur NEGATIVE NEGATIVE   US Transvaginal Non-ob  11/04/2015  CLINICAL DATA:  LEFT adnexal tenderness. EXAM: TRANSABDOMINAL AND TRANSVAGINAL ULTRASOUND OF PELVIS DOPPLER ULTRASOUND OF OVARIES TECHNIQUE: Both transabdominal and transvaginal ultrasound examinations of the pelvis were performed. Transabdominal technique was performed for global imaging of the pelvis including uterus, ovaries, adnexal regions, and pelvic cul-de-sac. It was necessary to proceed  with endovaginal exam following the transabdominal exam to visualize the adnexa. Color and duplex Doppler ultrasound was utilized to evaluate blood flow to the ovaries. COMPARISON:  None. FINDINGS: Uterus Measurements: 6.7 x 3.6 x 5.4 cm. No fibroids or other mass visualized. Endometrium Thickness: 3.4 mm. No focal abnormality visualized. Linear echogenic intrauterine device in place. Right ovary Measurements: 3.6 x 2.3 x 3 cm. Normal appearance/no adnexal mass. 2.5 cm dominant follicle. Left ovary 8.2 x 5.3 x 8 8 cm cystic LEFT adnexal mass with debris and multiple thin septations, Normal appearance/no adnexal mass. Pulsed Doppler evaluation of both ovaries demonstrates normal low-resistance arterial and venous waveforms. Other findings Small amount of free fluid is likely physiologic. IMPRESSION: 8.2 x 5.3 x 8.8 cm LEFT adnexal septated indeterminate cystic mass, consider surgical evaluation. This recommendation follows the consensus  statement: Management of Asymptomatic Ovarian and Other Adnexal Cysts Imaged at [place]: Society of Radiologists in [place]. Radiology 2010; [phone]. Electronically Signed   By: Elon Alas M.D.   On: 11/04/2015 01:53   US Pelvis Complete  11/04/2015  CLINICAL DATA:  LEFT adnexal tenderness. EXAM: TRANSABDOMINAL AND TRANSVAGINAL ULTRASOUND OF PELVIS DOPPLER ULTRASOUND OF OVARIES TECHNIQUE: Both transabdominal and transvaginal ultrasound examinations of the pelvis were performed. Transabdominal technique was performed for global imaging of the pelvis including uterus, ovaries, adnexal regions, and pelvic cul-de-sac. It was necessary to proceed with endovaginal exam following the transabdominal exam to visualize the adnexa. Color and duplex Doppler ultrasound was utilized to evaluate blood flow to the ovaries. COMPARISON:  None. FINDINGS: Uterus Measurements: 6.7 x 3.6 x 5.4 cm. No fibroids or other mass visualized. Endometrium Thickness: 3.4 mm. No focal abnormality visualized. Linear echogenic intrauterine device in place. Right ovary Measurements: 3.6 x 2.3 x 3 cm. Normal appearance/no adnexal mass. 2.5 cm dominant follicle. Left ovary 8.2 x 5.3 x 8 8 cm cystic LEFT adnexal mass with debris and multiple thin septations, Normal appearance/no adnexal mass. Pulsed Doppler evaluation of both ovaries demonstrates normal low-resistance arterial and venous waveforms. Other findings Small amount of free fluid is likely physiologic. IMPRESSION: 8.2 x 5.3 x 8.8 cm LEFT adnexal septated indeterminate cystic mass, consider surgical evaluation. This recommendation follows the consensus statement: Management of Asymptomatic Ovarian and Other Adnexal Cysts Imaged at [place]: Society of Radiologists in [place]. Radiology 2010; [phone]. Electronically Signed   By: Elon Alas M.D.   On: 11/04/2015 01:53   [place] Art/ven Flow Abd Pelv Doppler  11/04/2015   CLINICAL DATA:  LEFT adnexal tenderness. EXAM: TRANSABDOMINAL AND TRANSVAGINAL ULTRASOUND OF PELVIS DOPPLER ULTRASOUND OF OVARIES TECHNIQUE: Both transabdominal and transvaginal ultrasound examinations of the pelvis were performed. Transabdominal technique was performed for global imaging of the pelvis including uterus, ovaries, adnexal regions, and pelvic cul-de-sac. It was necessary to proceed with endovaginal exam following the transabdominal exam to visualize the adnexa. Color and duplex Doppler ultrasound was utilized to evaluate blood flow to the ovaries. COMPARISON:  None. FINDINGS: Uterus Measurements: 6.7 x 3.6 x 5.4 cm. No fibroids or other mass visualized. Endometrium Thickness: 3.4 mm. No focal abnormality visualized. Linear echogenic intrauterine device in place. Right ovary Measurements: 3.6 x 2.3 x 3 cm. Normal appearance/no adnexal mass. 2.5 cm dominant follicle. Left ovary 8.2 x 5.3 x 8 8 cm cystic LEFT adnexal mass with debris and multiple thin septations, Normal appearance/no adnexal mass. Pulsed Doppler evaluation of both ovaries demonstrates normal low-resistance arterial and venous waveforms. Other findings Small amount of free fluid is likely  physiologic. IMPRESSION: 8.2 x 5.3 x 8.8 cm LEFT adnexal septated indeterminate cystic mass, consider surgical evaluation. This recommendation follows the consensus statement: Management of Asymptomatic Ovarian and Other Adnexal Cysts Imaged at [place]: Society of Radiologists in [place]. Radiology 2010; [phone]. Electronically Signed   By: Elon Alas M.D.   On: 11/04/2015 01:53      ED Course  Procedures   1. Ovarian mass, left   2. Adnexal tenderness, left     MDM  Plan: [place] pending to r/o torsion.     2:45 AM Review of ultrasound shows large left cystic adnexal mass with debris.  She has Doppler verified blood flow to both ovaries without evidence of torsion.  Her pain is controlled at this  time. No emesis here in the department.  Discussed at length these findings with patient. Discussed potential for complex cyst versus potential ovarian cancer.  Patient does have an OB/GYN who reports she will follow-up within 48 hours. I have discussed the crease risk for ovarian torsion with large adnexal mass. She's been instructed to return to the emergency department for intractable pain, intractable vomiting or sudden onset return of left lower quadrant abdominal pain.  Patient and friend at bedside state understanding of discussion and are in agreement with the plan for discharge home. Will be discharged home with Roxicodone and Zofran. Discussed pain control with narcotic and adequate Tylenol dosing.   Jarrett Soho Dejanay Wamboldt, PA-C 11/04/15 [id number]  Blanchie Dessert, MD 11/04/15 [phone]

## 2015-11-06 LAB — GC/CHLAMYDIA PROBE AMP (~~LOC~~) NOT AT ARMC
Chlamydia: NEGATIVE
Neisseria Gonorrhea: NEGATIVE

## 2015-11-13 ENCOUNTER — Encounter (HOSPITAL_BASED_OUTPATIENT_CLINIC_OR_DEPARTMENT_OTHER): Payer: Self-pay | Admitting: *Deleted

## 2015-11-13 NOTE — Progress Notes (Signed)
NPO AFTER MN.  ARRIVE AT 0600.  NEEDS URINE PREG.  GETTING CBC DONE Thursday 11-16-2015.

## 2015-11-16 DIAGNOSIS — D271 Benign neoplasm of left ovary: Secondary | ICD-10-CM | POA: Diagnosis not present

## 2015-11-16 DIAGNOSIS — N83202 Unspecified ovarian cyst, left side: Secondary | ICD-10-CM | POA: Diagnosis present

## 2015-11-16 LAB — CBC
HCT: 40.1 % (ref 36.0–46.0)
HEMOGLOBIN: 13.1 g/dL (ref 12.0–15.0)
MCH: 30.5 pg (ref 26.0–34.0)
MCHC: 32.7 g/dL (ref 30.0–36.0)
MCV: 93.5 fL (ref 78.0–100.0)
Platelets: 206 10*3/uL (ref 150–400)
RBC: 4.29 MIL/uL (ref 3.87–5.11)
RDW: 12.6 % (ref 11.5–15.5)
WBC: 5.9 10*3/uL (ref 4.0–10.5)

## 2015-11-16 NOTE — H&P (Signed)
Judy Larson is an 35 y.o. female G1P1 with L adnexal mass.  Thin septations.  Also pelvic pain for ovarian cystectomy vs oopherectomy.  D/W pt r/b/a of surgery wishes to proceed.  Pt with abnormal pap smear and upcoming colposcopy to evaluate.    Pertinent Gynecological History: G1P1 SVD at 40weeks +abn pap - ASCUS HR HPV 5/17 for colposcopy No STD IUD for contraception  Menstrual History:  Patient's last menstrual period was 10/06/2015 (approximate).    Past Medical History  Diagnosis Date  . Complex ovarian cyst   . Wears glasses   Situational anxiety  Past Surgical History  Procedure Laterality Date  . Wisdom tooth extraction  age 43    Family History  Problem Relation Age of Onset  . Diabetes Father   . Hyperlipidemia Father   . Cancer Maternal Grandmother     breast    Social History:  reports that she has never smoked. She has never used smokeless tobacco. She reports that she drinks alcohol. She reports that she does not use illicit drugs.divorced  Allergies: No Known Allergies  No prescriptions prior to admission  has Mirena IUD  Review of Systems  Constitutional: Negative.   HENT: Negative.   Eyes: Negative.   Respiratory: Negative.   Cardiovascular: Negative.   Gastrointestinal: Positive for abdominal pain.  Genitourinary: Negative.   Musculoskeletal: Negative.   Skin: Negative.   Psychiatric/Behavioral: Negative.     Height 5\' 8"  (1.727 m), weight 63.504 kg (140 lb), last menstrual period 10/06/2015, not currently breastfeeding. Physical Exam  Constitutional: She is oriented to person, place, and time. She appears well-developed and well-nourished.  HENT:  Head: Normocephalic and atraumatic.  Cardiovascular: Normal rate and regular rhythm.   Respiratory: Effort normal and breath sounds normal. No respiratory distress. She has no wheezes.  GI: Soft. Bowel sounds are normal. She exhibits no distension. There is no tenderness.   Musculoskeletal: Normal range of motion.  Neurological: She is alert and oriented to person, place, and time.  Skin: Skin is warm and dry.  Psychiatric: She has a normal mood and affect. Her behavior is normal.    Results for orders placed or performed during the hospital encounter of 11/17/15 (from the past 24 hour(s))  CBC     Status: None   Collection Time: 11/16/15  9:10 AM  Result Value Ref Range   WBC 5.9 4.0 - 10.5 K/uL   RBC 4.29 3.87 - 5.11 MIL/uL   Hemoglobin 13.1 12.0 - 15.0 g/dL   HCT 40.1 36.0 - 46.0 %   MCV 93.5 78.0 - 100.0 fL   MCH 30.5 26.0 - 34.0 pg   MCHC 32.7 30.0 - 36.0 g/dL   RDW 12.6 11.5 - 15.5 %   Platelets 206 150 - 400 K/uL   [place] nl uterus and R ovary, L adnexal mass 8x8 cm - thin septations  Assessment/Plan: 35yo G!P1 with L adnexal mass for operative laparoscopy - cystectomy vs LSO D/w pt r/b/a Will proceed, Questions answered  Bovard-Stuckert, Javier Mamone 11/16/2015, 9:07 PM

## 2015-11-17 ENCOUNTER — Ambulatory Visit (HOSPITAL_BASED_OUTPATIENT_CLINIC_OR_DEPARTMENT_OTHER): Payer: BC Managed Care – PPO | Admitting: Anesthesiology

## 2015-11-17 ENCOUNTER — Encounter (HOSPITAL_BASED_OUTPATIENT_CLINIC_OR_DEPARTMENT_OTHER)
Admission: RE | Disposition: A | Discharge status: Home / Self Care | Payer: Self-pay | Source: Ambulatory Visit | Attending: Obstetrics and Gynecology

## 2015-11-17 ENCOUNTER — Ambulatory Visit (HOSPITAL_BASED_OUTPATIENT_CLINIC_OR_DEPARTMENT_OTHER)
Admission: RE | Admit: 2015-11-17 | Discharge: 2015-11-17 | Disposition: A | Discharge status: Home / Self Care | Payer: BC Managed Care – PPO | Source: Ambulatory Visit | Attending: Obstetrics and Gynecology | Admitting: Obstetrics and Gynecology

## 2015-11-17 ENCOUNTER — Encounter (HOSPITAL_BASED_OUTPATIENT_CLINIC_OR_DEPARTMENT_OTHER): Payer: Self-pay | Admitting: *Deleted

## 2015-11-17 DIAGNOSIS — D271 Benign neoplasm of left ovary: Secondary | ICD-10-CM | POA: Insufficient documentation

## 2015-11-17 DIAGNOSIS — N83299 Other ovarian cyst, unspecified side: Secondary | ICD-10-CM | POA: Diagnosis present

## 2015-11-17 DIAGNOSIS — D279 Benign neoplasm of unspecified ovary: Secondary | ICD-10-CM | POA: Diagnosis present

## 2015-11-17 HISTORY — DX: Presence of spectacles and contact lenses: Z97.3

## 2015-11-17 HISTORY — DX: Other ovarian cyst, unspecified side: N83.299

## 2015-11-17 HISTORY — DX: Benign neoplasm of unspecified ovary: D27.9

## 2015-11-17 HISTORY — PX: LAPAROSCOPIC OVARIAN CYSTECTOMY: SHX6248

## 2015-11-17 LAB — POCT PREGNANCY, URINE: Preg Test, Ur: NEGATIVE

## 2015-11-17 SURGERY — EXCISION, CYST, OVARY, LAPAROSCOPIC
Anesthesia: General | Site: Abdomen | Laterality: Left

## 2015-11-17 MED ORDER — SUGAMMADEX SODIUM 200 MG/2ML IV SOLN
INTRAVENOUS | Status: AC
  Filled 2015-11-17: qty 2

## 2015-11-17 MED ORDER — ONDANSETRON HCL 4 MG/2ML IJ SOLN
INTRAMUSCULAR | Status: DC | PRN
  Administered 2015-11-17: 4 mg via INTRAVENOUS

## 2015-11-17 MED ORDER — LIDOCAINE HCL (CARDIAC) 20 MG/ML IV SOLN
INTRAVENOUS | Status: AC
  Filled 2015-11-17: qty 5

## 2015-11-17 MED ORDER — FENTANYL CITRATE (PF) 250 MCG/5ML IJ SOLN
INTRAMUSCULAR | Status: AC
  Filled 2015-11-17: qty 5

## 2015-11-17 MED ORDER — BUPIVACAINE HCL (PF) 0.25 % IJ SOLN
INTRAMUSCULAR | Status: DC | PRN
  Administered 2015-11-17: 10 mL

## 2015-11-17 MED ORDER — MEPERIDINE HCL 25 MG/ML IJ SOLN
6.2500 mg | INTRAMUSCULAR | Status: DC | PRN
  Filled 2015-11-17: qty 1

## 2015-11-17 MED ORDER — ROCURONIUM BROMIDE 100 MG/10ML IV SOLN
INTRAVENOUS | Status: AC
  Filled 2015-11-17: qty 1

## 2015-11-17 MED ORDER — ROCURONIUM BROMIDE 100 MG/10ML IV SOLN
INTRAVENOUS | Status: DC | PRN
  Administered 2015-11-17: 25 mg via INTRAVENOUS
  Administered 2015-11-17: 5 mg via INTRAVENOUS

## 2015-11-17 MED ORDER — KETOROLAC TROMETHAMINE 30 MG/ML IJ SOLN
INTRAMUSCULAR | Status: DC | PRN
  Administered 2015-11-17: 30 mg via INTRAVENOUS

## 2015-11-17 MED ORDER — LIDOCAINE HCL 4 % EX SOLN
CUTANEOUS | Status: DC | PRN
  Administered 2015-11-17: 2 mL via TOPICAL

## 2015-11-17 MED ORDER — SUGAMMADEX SODIUM 200 MG/2ML IV SOLN
INTRAVENOUS | Status: DC | PRN
  Administered 2015-11-17: 200 mg via INTRAVENOUS

## 2015-11-17 MED ORDER — MIDAZOLAM HCL 5 MG/5ML IJ SOLN
INTRAMUSCULAR | Status: DC | PRN
  Administered 2015-11-17: 2 mg via INTRAVENOUS

## 2015-11-17 MED ORDER — EPHEDRINE SULFATE 50 MG/ML IJ SOLN
INTRAMUSCULAR | Status: DC | PRN
  Administered 2015-11-17: 10 mg via INTRAVENOUS

## 2015-11-17 MED ORDER — LACTATED RINGERS IV SOLN
INTRAVENOUS | Status: DC
  Administered 2015-11-17: 07:00:00 via INTRAVENOUS
  Filled 2015-11-17: qty 1000

## 2015-11-17 MED ORDER — EPHEDRINE SULFATE 50 MG/ML IJ SOLN
INTRAMUSCULAR | Status: AC
  Filled 2015-11-17: qty 1

## 2015-11-17 MED ORDER — SUCCINYLCHOLINE CHLORIDE 200 MG/10ML IV SOSY
PREFILLED_SYRINGE | INTRAVENOUS | Status: DC | PRN
  Administered 2015-11-17: 100 mg via INTRAVENOUS

## 2015-11-17 MED ORDER — FENTANYL CITRATE (PF) 100 MCG/2ML IJ SOLN
INTRAMUSCULAR | Status: DC | PRN
  Administered 2015-11-17 (×4): 50 ug via INTRAVENOUS

## 2015-11-17 MED ORDER — HYDROMORPHONE HCL 1 MG/ML IJ SOLN
0.2500 mg | INTRAMUSCULAR | Status: DC | PRN
  Filled 2015-11-17: qty 1

## 2015-11-17 MED ORDER — ARTIFICIAL TEARS OP OINT
TOPICAL_OINTMENT | OPHTHALMIC | Status: AC
  Filled 2015-11-17: qty 3.5

## 2015-11-17 MED ORDER — KETOROLAC TROMETHAMINE 30 MG/ML IJ SOLN
INTRAMUSCULAR | Status: AC
  Filled 2015-11-17: qty 1

## 2015-11-17 MED ORDER — OXYCODONE HCL 5 MG PO TABS
5.0000 mg | ORAL_TABLET | Freq: Once | ORAL | Status: DC | PRN
Start: 2015-11-17 — End: 2015-11-17
  Filled 2015-11-17: qty 1

## 2015-11-17 MED ORDER — METOCLOPRAMIDE HCL 5 MG/ML IJ SOLN
INTRAMUSCULAR | Status: DC | PRN
  Administered 2015-11-17: 5 mg via INTRAVENOUS

## 2015-11-17 MED ORDER — ONDANSETRON HCL 4 MG/2ML IJ SOLN
INTRAMUSCULAR | Status: AC
  Filled 2015-11-17: qty 2

## 2015-11-17 MED ORDER — DEXAMETHASONE SODIUM PHOSPHATE 10 MG/ML IJ SOLN
INTRAMUSCULAR | Status: AC
  Filled 2015-11-17: qty 1

## 2015-11-17 MED ORDER — OXYCODONE HCL 5 MG PO TABS: 5.0000 mg | ORAL_TABLET | Freq: Four times a day (QID) | ORAL | Status: DC | PRN

## 2015-11-17 MED ORDER — IBUPROFEN 800 MG PO TABS: 800.0000 mg | ORAL_TABLET | Freq: Three times a day (TID) | ORAL | Status: AC | PRN

## 2015-11-17 MED ORDER — LIDOCAINE HCL (CARDIAC) 20 MG/ML IV SOLN
INTRAVENOUS | Status: DC | PRN
  Administered 2015-11-17: 60 mg via INTRAVENOUS

## 2015-11-17 MED ORDER — MIDAZOLAM HCL 2 MG/2ML IJ SOLN
INTRAMUSCULAR | Status: AC
  Filled 2015-11-17: qty 2

## 2015-11-17 MED ORDER — OXYCODONE HCL 5 MG/5ML PO SOLN
5.0000 mg | Freq: Once | ORAL | Status: DC | PRN
  Filled 2015-11-17: qty 5

## 2015-11-17 MED ORDER — LACTATED RINGERS IV SOLN
INTRAVENOUS | Status: DC
  Administered 2015-11-17 (×2): via INTRAVENOUS
  Filled 2015-11-17: qty 1000

## 2015-11-17 MED ORDER — DEXAMETHASONE SODIUM PHOSPHATE 4 MG/ML IJ SOLN
INTRAMUSCULAR | Status: DC | PRN
  Administered 2015-11-17: 10 mg via INTRAVENOUS

## 2015-11-17 MED ORDER — PROPOFOL 10 MG/ML IV BOLUS
INTRAVENOUS | Status: AC
  Filled 2015-11-17: qty 40

## 2015-11-17 MED ORDER — PROPOFOL 10 MG/ML IV BOLUS
INTRAVENOUS | Status: DC | PRN
  Administered 2015-11-17: 160 mg via INTRAVENOUS

## 2015-11-17 MED ORDER — METOCLOPRAMIDE HCL 5 MG/ML IJ SOLN
INTRAMUSCULAR | Status: AC
  Filled 2015-11-17: qty 2

## 2015-11-17 SURGICAL SUPPLY — 68 items
APL SKNCLS STERI-STRIP NONHPOA (GAUZE/BANDAGES/DRESSINGS)
APPLICATOR COTTON TIP 6IN STRL (MISCELLANEOUS) IMPLANT
APPLIER CLIP LOGIC TI 5 (MISCELLANEOUS) IMPLANT
APR CLP MED LRG 33X5 (MISCELLANEOUS)
BAG SPEC RTRVL LRG 6X4 10 (ENDOMECHANICALS) ×1
BANDAGE ADH SHEER 1  50/CT (GAUZE/BANDAGES/DRESSINGS) IMPLANT
BENZOIN TINCTURE PRP APPL 2/3 (GAUZE/BANDAGES/DRESSINGS) IMPLANT
BLADE SURG 11 STRL SS (BLADE) ×2 IMPLANT
CABLE HIGH FREQUENCY MONO STRZ (ELECTRODE) IMPLANT
CANISTER SUCTION 1200CC (MISCELLANEOUS) IMPLANT
CANISTER SUCTION 2500CC (MISCELLANEOUS) IMPLANT
CATH ROBINSON RED A/P 16FR (CATHETERS) ×1 IMPLANT
CHLORAPREP W/TINT 26ML (MISCELLANEOUS) ×4 IMPLANT
DRAPE UNDERBUTTOCKS STRL (DRAPE) ×2 IMPLANT
DRSG COVADERM PLUS 2X2 (GAUZE/BANDAGES/DRESSINGS) ×4 IMPLANT
DRSG OPSITE POSTOP 3X4 (GAUZE/BANDAGES/DRESSINGS) IMPLANT
DRSG TELFA 3X8 NADH (GAUZE/BANDAGES/DRESSINGS) IMPLANT
DURAPREP 26ML APPLICATOR (WOUND CARE) IMPLANT
ELECT REM PT RETURN 9FT ADLT (ELECTROSURGICAL) ×2
ELECTRODE REM PT RTRN 9FT ADLT (ELECTROSURGICAL) ×1 IMPLANT
GLOVE BIO SURGEON STRL SZ 6.5 (GLOVE) ×5 IMPLANT
GLOVE BIOGEL PI IND STRL 7.0 (GLOVE) ×4 IMPLANT
GLOVE BIOGEL PI IND STRL 7.5 (GLOVE) ×1 IMPLANT
GLOVE BIOGEL PI INDICATOR 7.0 (GLOVE) ×4
GLOVE BIOGEL PI INDICATOR 7.5 (GLOVE) ×1
GLOVE SURG SS PI 7.0 STRL IVOR (GLOVE) ×4 IMPLANT
GOWN STRL REUS W/ TWL LRG LVL3 (GOWN DISPOSABLE) ×1 IMPLANT
GOWN STRL REUS W/TWL LRG LVL3 (GOWN DISPOSABLE) ×8 IMPLANT
KIT ROOM TURNOVER WOR (KITS) ×2 IMPLANT
LIQUID BAND (GAUZE/BANDAGES/DRESSINGS) ×2 IMPLANT
NEEDLE HYPO 25X1 1.5 SAFETY (NEEDLE) ×2 IMPLANT
NEEDLE INSUFFLATION 120MM (ENDOMECHANICALS) ×2 IMPLANT
NEEDLE INSUFFLATION 14GA 120MM (NEEDLE) ×2 IMPLANT
NEEDLE INSUFFLATION 14GA 150MM (NEEDLE) ×2 IMPLANT
NEEDLE SPNL 18GX3.5 QUINCKE PK (NEEDLE) ×2 IMPLANT
NS IRRIG 1000ML POUR BTL (IV SOLUTION) ×2 IMPLANT
NS IRRIG 500ML POUR BTL (IV SOLUTION) ×2 IMPLANT
PACK BASIN DAY SURGERY FS (CUSTOM PROCEDURE TRAY) ×2 IMPLANT
PACK LAPAROSCOPY II (CUSTOM PROCEDURE TRAY) ×2 IMPLANT
PAD ION LEFT ARM DISP (MISCELLANEOUS) ×4 IMPLANT
PAD ION RIGHT ARM DISP (MISCELLANEOUS) ×2 IMPLANT
PAD OB MATERNITY 4.3X12.25 (PERSONAL CARE ITEMS) ×2 IMPLANT
PAD PREP 24X48 CUFFED NSTRL (MISCELLANEOUS) ×2 IMPLANT
POUCH SPECIMEN RETRIEVAL 10MM (ENDOMECHANICALS) ×2 IMPLANT
SCISSORS LAP 5X35 DISP (ENDOMECHANICALS) IMPLANT
SET IRRIG TUBING LAPAROSCOPIC (IRRIGATION / IRRIGATOR) IMPLANT
SHEARS HARMONIC ACE PLUS 36CM (ENDOMECHANICALS) ×2 IMPLANT
SLEEVE XCEL OPT CAN 5 100 (ENDOMECHANICALS) ×2 IMPLANT
SOLUTION ANTI FOG 6CC (MISCELLANEOUS) ×2 IMPLANT
SOLUTION ELECTROLUBE (MISCELLANEOUS) IMPLANT
STRIP CLOSURE SKIN 1/4X4 (GAUZE/BANDAGES/DRESSINGS) IMPLANT
SUT VIC AB 2-0 UR6 27 (SUTURE) ×2 IMPLANT
SUT VICRYL 0 UR6 27IN ABS (SUTURE) IMPLANT
SUT VICRYL 4-0 PS2 18IN ABS (SUTURE) ×4 IMPLANT
SYR 3ML 23GX1 SAFETY (SYRINGE) IMPLANT
SYR CONTROL 10ML LL (SYRINGE) ×2 IMPLANT
SYRINGE 10CC LL (SYRINGE) ×2 IMPLANT
SYRINGE 60CC LL (MISCELLANEOUS) ×2 IMPLANT
TOWEL OR 17X24 6PK STRL BLUE (TOWEL DISPOSABLE) ×4 IMPLANT
TRAY DSU PREP LF (CUSTOM PROCEDURE TRAY) ×2 IMPLANT
TRAY FOLEY BAG SILVER LF 14FR (CATHETERS) ×2 IMPLANT
TRAY FOLEY CATH SILVER 14FR (SET/KITS/TRAYS/PACK) ×2 IMPLANT
TROCAR XCEL BLUNT TIP 100MML (ENDOMECHANICALS) ×2 IMPLANT
TROCAR XCEL NON-BLD 11X100MML (ENDOMECHANICALS) IMPLANT
TROCAR XCEL NON-BLD 5MMX100MML (ENDOMECHANICALS) ×4 IMPLANT
TUBING INSUFFLATION 10FT LAP (TUBING) ×2 IMPLANT
WARMER LAPAROSCOPE (MISCELLANEOUS) ×2 IMPLANT
WATER STERILE IRR 500ML POUR (IV SOLUTION) ×2 IMPLANT

## 2015-11-17 NOTE — Anesthesia Procedure Notes (Signed)
Procedure Name: Intubation Date/Time: 11/17/2015 8:01 AM Performed by: Mechele Claude Pre-anesthesia Checklist: Patient identified, Emergency Drugs available, Suction available and Patient being monitored Patient Re-evaluated:Patient Re-evaluated prior to inductionOxygen Delivery Method: Circle system utilized Preoxygenation: Pre-oxygenation with 100% oxygen Intubation Type: IV induction Ventilation: Mask ventilation without difficulty Laryngoscope Size: Mac and 3 Grade View: Grade I Tube type: Oral Tube size: 7.0 mm Number of attempts: 1 Airway Equipment and Method: Stylet and LTA kit utilized Placement Confirmation: ETT inserted through vocal cords under direct vision,  positive ETCO2 and breath sounds checked- equal and bilateral Secured at: 20 cm Tube secured with: Tape Dental Injury: Teeth and Oropharynx as per pre-operative assessment

## 2015-11-17 NOTE — Transfer of Care (Signed)
  Last Vitals:  Filed Vitals:   11/17/15 0617  BP: 103/68  Pulse: 69  Temp: 36.6 C  Resp: 14    Last Pain: There were no vitals filed for this visit.    Patients Stated Pain Goal: 5 (11/17/15 [id number])  Immediate Anesthesia Transfer of Care Note  Patient: Judy Larson  Procedure(s) Performed: Procedure(s) (LRB): LAPAROSCOPIC OVARIAN CYSTECTOMY (Left)  Patient Location: PACU  Anesthesia Type: General  Level of Consciousness: awake, alert  and oriented  Airway & Oxygen Therapy: Patient Spontanous Breathing and Patient connected to nasal cannula oxygen  Post-op Assessment: Report given to PACU RN and Post -op Vital signs reviewed and stable  Post vital signs: Reviewed and stable  Complications: No apparent anesthesia complications

## 2015-11-17 NOTE — Anesthesia Postprocedure Evaluation (Signed)
Anesthesia Post Note  Patient: Judy Larson  Procedure(s) Performed: Procedure(s) (LRB): LAPAROSCOPIC OVARIAN CYSTECTOMY (Left)  Patient location during evaluation: PACU Anesthesia Type: General Level of consciousness: awake and alert Pain management: pain level controlled Vital Signs Assessment: post-procedure vital signs reviewed and stable Respiratory status: spontaneous breathing, nonlabored ventilation, respiratory function stable and patient connected to face mask oxygen Cardiovascular status: blood pressure returned to baseline and stable Postop Assessment: no signs of nausea or vomiting Anesthetic complications: no    Last Vitals:  Filed Vitals:   11/17/15 0935 11/17/15 0945  BP: 123/49 110/72  Pulse: 74 62  Temp: 36.2 C   Resp: 10 15    Last Pain:  Filed Vitals:   11/17/15 0949  PainSc: 4                  Terre Hanneman A

## 2015-11-17 NOTE — Anesthesia Preprocedure Evaluation (Signed)

## 2015-11-17 NOTE — Progress Notes (Signed)
Patient ID: Judy Larson, female   DOB: 12/14/1980, 35 y.o.   MRN: [id number] Pt presents for operative laparoscopy, poss cystectomy vs LSO.  D/W pt r/b/a, no changes to H&P will proceed

## 2015-11-17 NOTE — Op Note (Deleted)
NAMEBREALE, WILKENS NO.:  [id number]  MEDICAL RECORD NO.:  [id number]  LOCATION:                                 FACILITY:  PHYSICIAN:  Thornell Sartorius, MD        DATE OF BIRTH:  1980-09-21  DATE OF PROCEDURE:  11/17/2015 DATE OF DISCHARGE:                              OPERATIVE REPORT   PREOPERATIVE DIAGNOSIS:  Complex left ovarian cyst.  POSTOPERATIVE DIAGNOSIS:  Complex left ovarian cyst, likely dermoid.  PROCEDURE:  Operative laparoscopy, left salpingo-oophorectomy.  SURGEON:  Thornell Sartorius, MD  ASSISTANT:  Carlynn Purl, D.O.  ANESTHESIA:  General and local.  IV FLUIDS:  1500 mL.  URINE OUTPUT:  I and O cath.  ESTIMATED BLOOD LOSS:  Minimal.  LOCAL MEDICATION:  Marcaine 10%.  PATHOLOGY:  Left salpingo-oophorectomy to Pathology.  PROCEDURE IN DETAIL:  After informed consent was reviewed with the patient and her friend, she was transported back to the operating room, placed on the table in supine position.  General anesthesia was induced, found to be adequate.  She was then placed in the [place]. Her arms were bilaterally tucked.  She was prepped and draped in normal sterile fashion.  A Hulka manipulator was placed on her cervix.  Gloves were changed, and attention was turned to the abdominal portion of the case, and approximately XX123456 mm infraumbilical incision was made. Finally, the underlying fascia was cleaned off with hemostat, elevated with Kocher clamps, and the fascia was incised.  This incision was extended superiorly and inferiorly, and Hasson trocar was placed after a pursestring of 0 Vicryl had been placed.  Pelvic survey revealed large left ovarian cyst.  Normal right ovary.  Normal uterus.  The decision was made to proceed with left salpingo-oophorectomy.  The utero-ovarian vessels were ligated with Harmonic Scalpel, as was the mesosalpinx to and cut along the tube through the distal portion of the tube was incised.  The  ovary was placed in a bag.  A needle was placed in the tube and ovary, and the fluid was drained.  This was sent to Cytology. The cyst was then raised up and minimally morcellated in the bag at the umbilicus.  The pelvic survey was performed after morcellation was complete.  During morcellation, hair, sebaceous tissue and teeth were found.  The bag was removed with the uterus.  Inspection of the pelvis revealed hemostatic pedicles.  A small amount of blood was removed from the pouch of Douglas.  The gas was evacuated from the abdomen.  The trocars were removed under direct visualization.  The pursestring was closed. The skin was closed with 4-0 Vicryl in subcuticular fashion.  Dermabond was applied.  The patient tolerated the procedure well.  Sponge, lap, and needle counts were correct x2 per the operating staff.     Thornell Sartorius, MD   ______________________________ Thornell Sartorius, MD    JB/MEDQ  D:  11/17/2015  T:  11/17/2015  Job:  [id number]

## 2015-11-17 NOTE — Brief Op Note (Signed)
11/17/2015  9:35 AM  PATIENT:  Judy Larson  35 y.o. female  PRE-OPERATIVE DIAGNOSIS:  Complex Ovarian Cyst  POST-OPERATIVE DIAGNOSIS:  Complex Ovarian Cyst, Dermoid  PROCEDURE:  Procedure(s): LAPAROSCOPIC SO  SURGEON:  Surgeon(s) and Role:    * Janyth Contes, MD - Primary    * Cecilia Worema Banga, DO - Assisting  ANESTHESIA:   general and local  EBL:  Total I/O In: 1500 [I.V.:1500] Out: - i/0 cath, min EBL  BLOOD ADMINISTERED:none  DRAINS: none   LOCAL MEDICATIONS USED:  MARCAINE  .25%  and Amount: 10 ml  SPECIMEN:  Source of Specimen:  LSO  DISPOSITION OF SPECIMEN:  PATHOLOGY  COUNTS:  YES  TOURNIQUET:  * No tourniquets in log *  DICTATION: .Other Dictation: Dictation Number [id number]  PLAN OF CARE: Discharge to home after PACU  PATIENT DISPOSITION:  PACU - hemodynamically stable.   Delay start of Pharmacological VTE agent (>24hrs) due to surgical blood loss or risk of bleeding: not applicable

## 2015-11-17 NOTE — Op Note (Signed)
Judy Larson, Judy Larson NO.:  [id number]  MEDICAL RECORD NO.:  [id number]  LOCATION:                                 FACILITY:  PHYSICIAN:  Thornell Sartorius, MD        DATE OF BIRTH:  1980/11/02  DATE OF PROCEDURE:  11/17/2015 DATE OF DISCHARGE:                              OPERATIVE REPORT   PREOPERATIVE DIAGNOSIS:  Complex left ovarian cyst.  POSTOPERATIVE DIAGNOSIS:  Complex left ovarian cyst, likely dermoid.  PROCEDURE:  Operative laparoscopy, left salpingo-oophorectomy.  SURGEON:  Thornell Sartorius, MD  ASSISTANT:  Carlynn Purl, D.O.  ANESTHESIA:  General and local.  IV FLUIDS:  1500 mL.  URINE OUTPUT:  I and O cath.  ESTIMATED BLOOD LOSS:  Minimal.  LOCAL MEDICATION:  Marcaine 10%.  PATHOLOGY:  Left salpingo-oophorectomy to Pathology.  PROCEDURE IN DETAIL:  After informed consent was reviewed with the patient and her friend, she was transported back to the operating room, placed on the table in supine position.  General anesthesia was induced, found to be adequate.  She was then placed in the [place]. Her arms were bilaterally tucked.  She was prepped and draped in normal sterile fashion.  A Hulka manipulator was placed on her cervix.  Gloves were changed, and attention was turned to the abdominal portion of the case, and approximately XX123456 mm infraumbilical incision was made. Finally, the underlying fascia was cleaned off with hemostat, elevated with Kocher clamps, and the fascia was incised.  This incision was extended superiorly and inferiorly, and Hasson trocar was placed after a pursestring of 0 Vicryl had been placed.  Pelvic survey revealed large left ovarian cyst.  Normal right ovary.  Normal uterus.  The decision was made to proceed with left salpingo-oophorectomy.  The utero-ovarian vessels were ligated with Harmonic Scalpel, as was the mesosalpinx to and cut along the tube through the distal portion of the tube was incised.  The  ovary was placed in a bag.  A needle was placed in the tube and ovary, and the fluid was drained.  This was sent to Cytology. The cyst was then raised up and minimally morcellated in the bag at the umbilicus.  The pelvic survey was performed after morcellation was complete.  During morcellation, hair, sebaceous tissue and teeth were found.  The bag was removed with the uterus.  Inspection of the pelvis revealed hemostatic pedicles.  A small amount of blood was removed from the pouch of Douglas.  The gas was evacuated from the abdomen.  The trocars were removed under direct visualization.  The pursestring was closed. The skin was closed with 4-0 Vicryl in subcuticular fashion.  Dermabond was applied.  The patient tolerated the procedure well.  Sponge, lap, and needle counts were correct x2 per the operating staff.     Thornell Sartorius, MD   ______________________________ Thornell Sartorius, MD    JB/MEDQ  D:  11/17/2015  T:  11/17/2015  Job:  [id number]

## 2015-11-17 NOTE — Discharge Instructions (Signed)

## 2015-11-20 ENCOUNTER — Encounter (HOSPITAL_BASED_OUTPATIENT_CLINIC_OR_DEPARTMENT_OTHER): Payer: Self-pay | Admitting: Obstetrics and Gynecology

## 2017-02-17 IMAGING — US US TRANSVAGINAL NON-OB
1 series · 13 of 25 positions shown · non-contrast
Comparison: None.

CLINICAL DATA: LEFT adnexal tenderness.

EXAM:
TRANSABDOMINAL AND TRANSVAGINAL ULTRASOUND OF PELVIS
DOPPLER ULTRASOUND OF OVARIES
TECHNIQUE: Both transabdominal and transvaginal ultrasound examinations of the
pelvis were performed. Transabdominal technique was performed for
global imaging of the pelvis including uterus, ovaries, adnexal
regions, and pelvic cul-de-sac.
It was necessary to proceed with endovaginal exam following the
transabdominal exam to visualize the adnexa. Color and duplex
Doppler ultrasound was utilized to evaluate blood flow to the
ovaries.

[Series 1: us transvaginal non-ob · 0.24mm/px · 68 acquisitions, 13 frames shown]
[im 1/68]
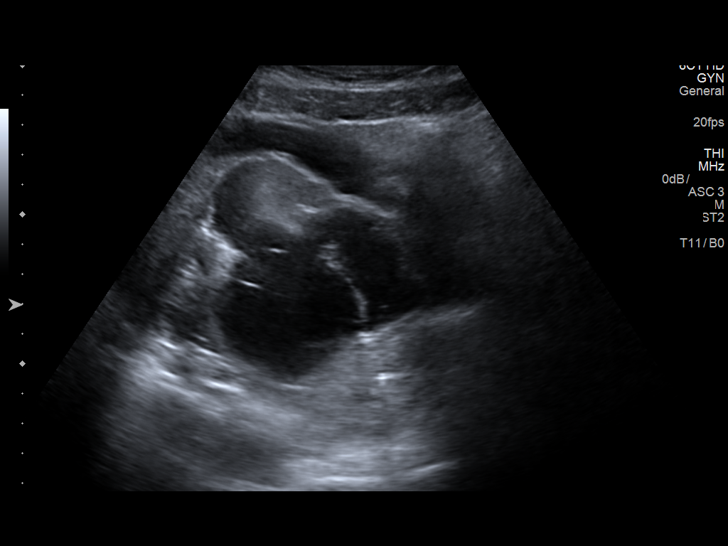
[im 6/68]
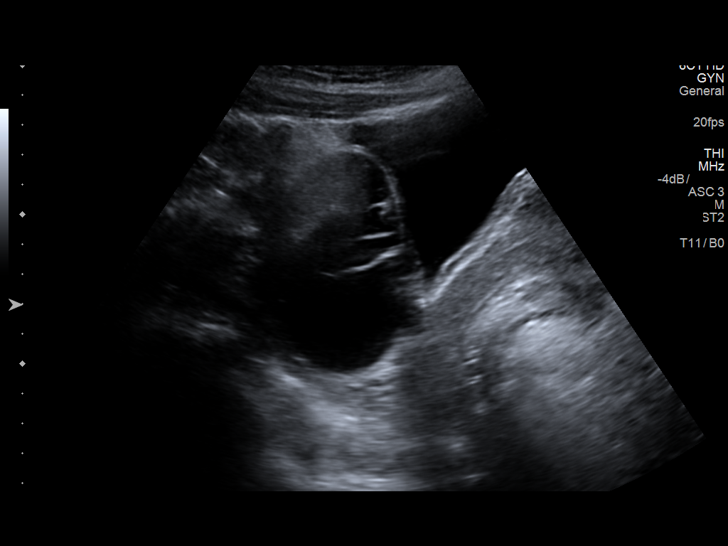
[im 12/68]
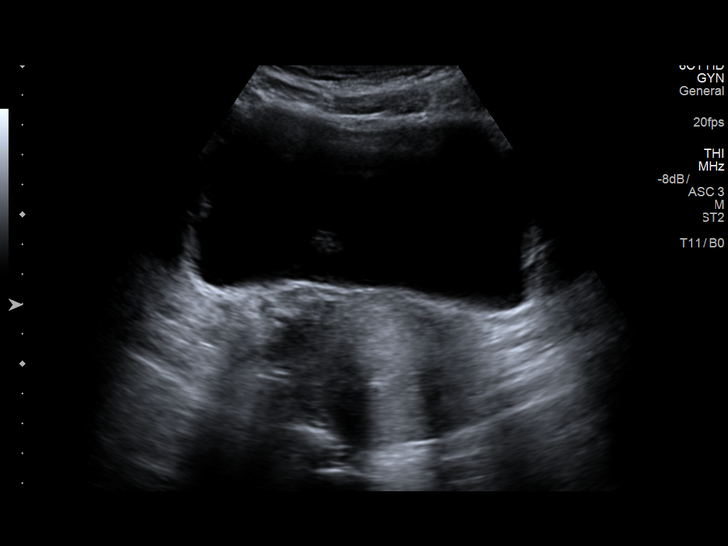
[im 17/68]
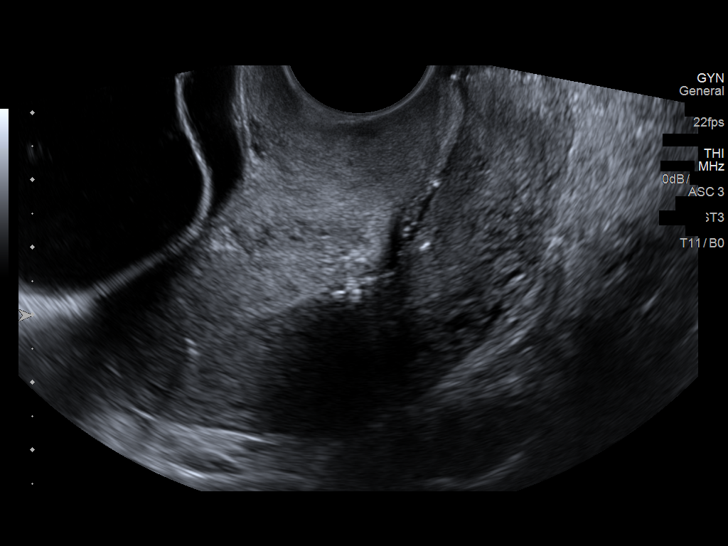
[im 23/68]
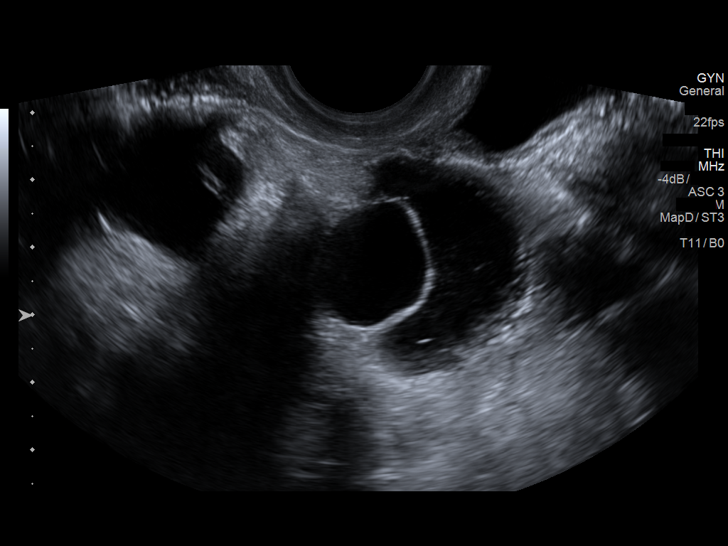
[im 28/68]
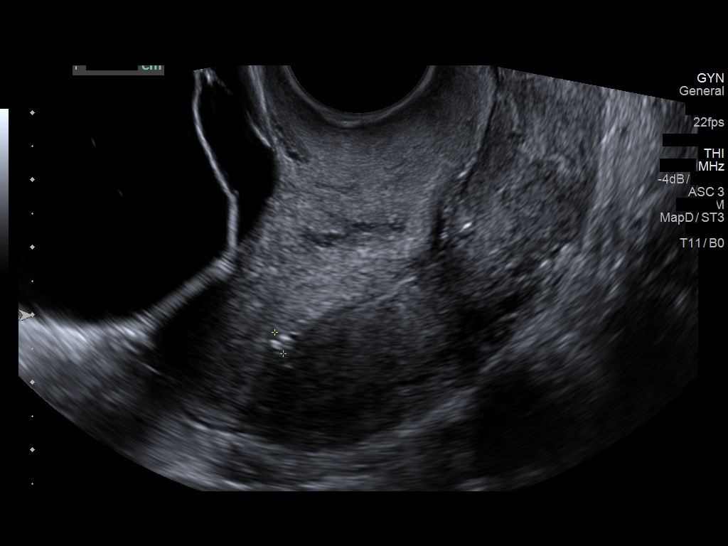
[im 34/68]
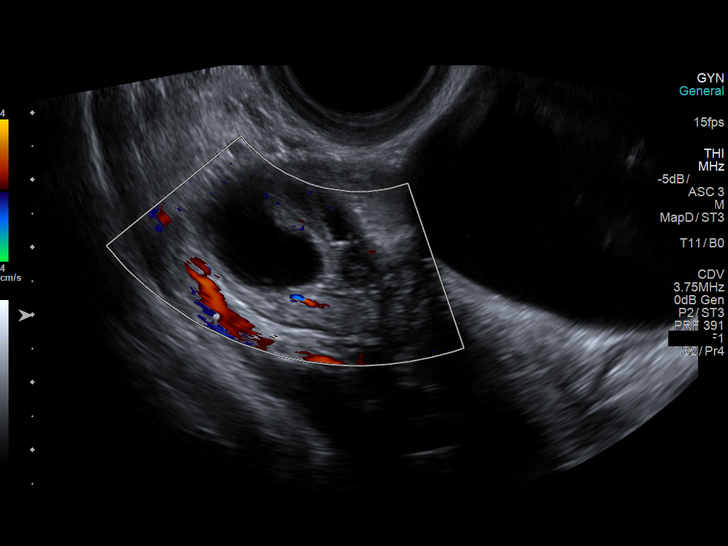
[im 40/68]
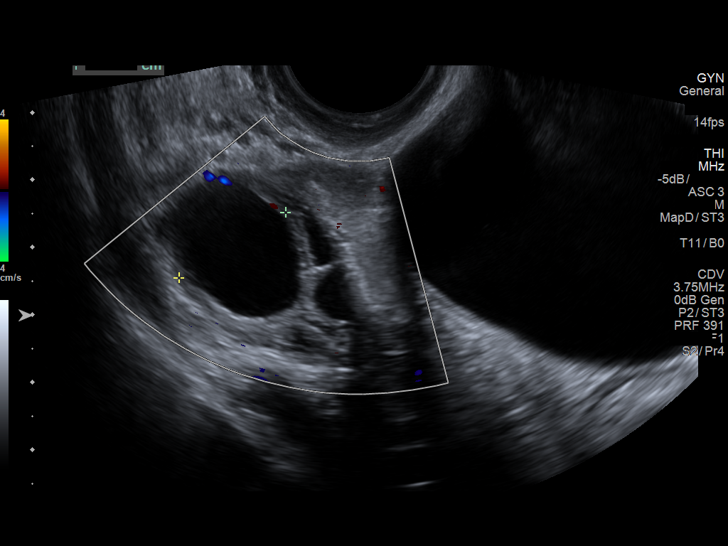
[im 45/68]
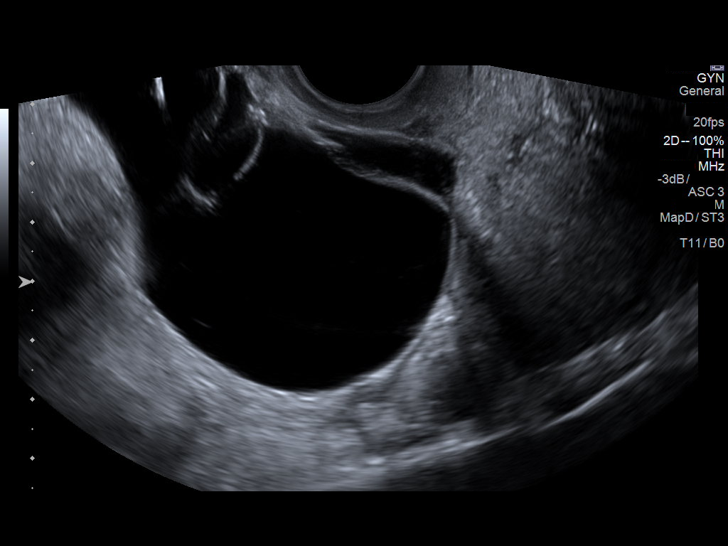
[im 51/68]
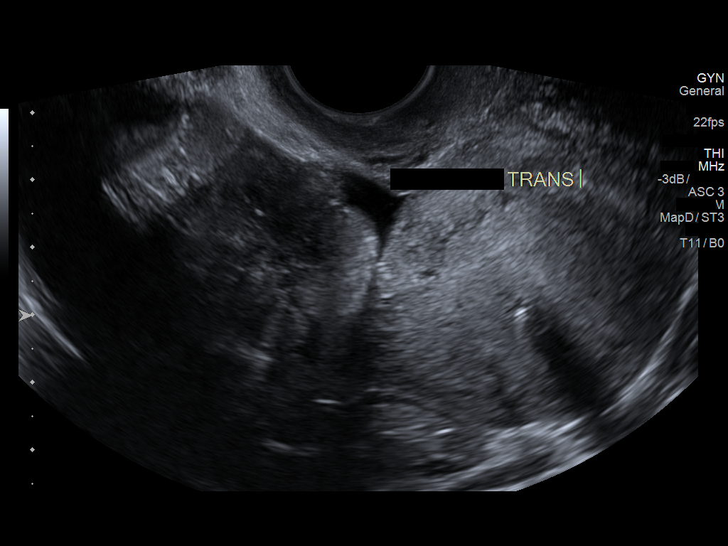
[im 56/68]
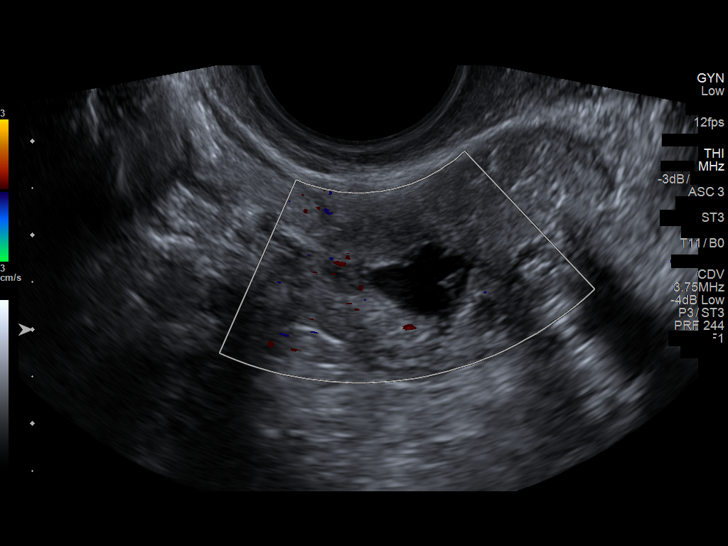
[im 62/68]
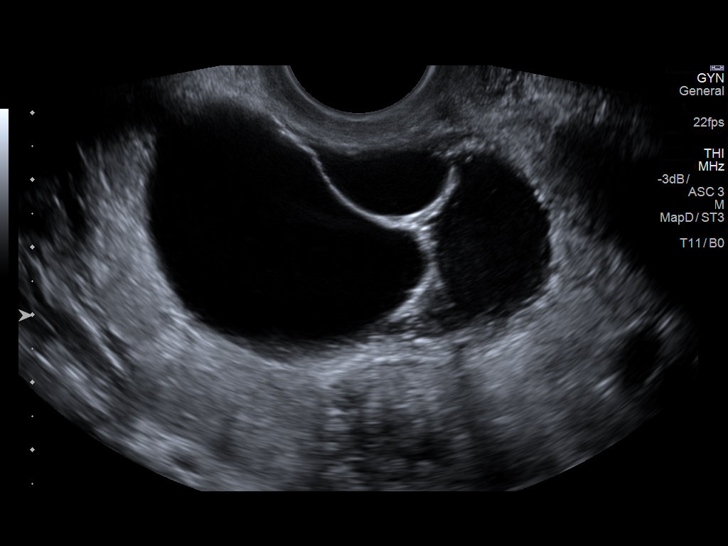
[im 68/68]
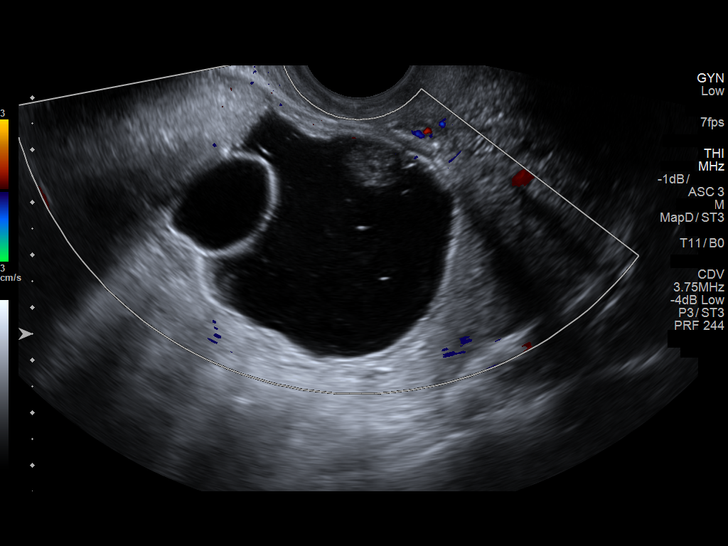

[13 of 25 positions shown; findings below may reference images not displayed]

FINDINGS: Uterus

Measurements: 6.7 x 3.6 x 5.4 cm. No fibroids or other mass
visualized.

Endometrium

Thickness: 3.4 mm. No focal abnormality visualized. Linear echogenic
intrauterine device in place.

Right ovary

Measurements: 3.6 x 2.3 x 3 cm. Normal appearance/no adnexal mass.
2.5 cm dominant follicle.

Left ovary

8.2 x 5.3 x 8 8 cm cystic LEFT adnexal mass with debris and multiple
thin septations, Normal appearance/no adnexal mass.

Pulsed Doppler evaluation of both ovaries demonstrates normal
low-resistance arterial and venous waveforms.

Other findings

Small amount of free fluid is likely physiologic.
IMPRESSION: 8.2 x 5.3 x 8.8 cm LEFT adnexal septated indeterminate cystic mass,
consider surgical evaluation. This recommendation follows the
consensus statement: Management of Asymptomatic Ovarian and Other
Adnexal Cysts Imaged at US: Society of Radiologists in Ultrasound

## 2019-07-22 ENCOUNTER — Ambulatory Visit: Payer: BC Managed Care – PPO | Attending: Internal Medicine

## 2019-07-22 DIAGNOSIS — Z23 Encounter for immunization: Secondary | ICD-10-CM | POA: Insufficient documentation

## 2019-07-22 NOTE — Progress Notes (Signed)
   Covid-19 Vaccination Clinic  Name:  Judy Larson    MRN: [id number] DOB: 1980-06-28  07/22/2019  [name] was observed post Covid-19 immunization for 15 minutes without incidence. She was provided with Vaccine Information Sheet and instruction to access the V-Safe system.   [name] was instructed to call 911 with any severe reactions post vaccine: Marland Kitchen Difficulty breathing  . Swelling of your face and throat  . A fast heartbeat  . A bad rash all over your body  . Dizziness and weakness    Immunizations Administered    Name Date Dose VIS Date Route   Pfizer COVID-19 Vaccine 07/22/2019  4:25 PM 0.3 mL 05/07/2019 Intramuscular   Manufacturer: [place]   Lot: [id number]   [place]: [id number]

## 2019-08-18 ENCOUNTER — Ambulatory Visit: Payer: BC Managed Care – PPO | Attending: Internal Medicine

## 2019-08-18 DIAGNOSIS — Z23 Encounter for immunization: Secondary | ICD-10-CM

## 2019-08-18 NOTE — Progress Notes (Signed)
   Covid-19 Vaccination Clinic  Name:  Judy Larson    MRN: [id number] DOB: Apr 22, 1981  08/18/2019  [name] was observed post Covid-19 immunization for 15 minutes without incident. She was provided with Vaccine Information Sheet and instruction to access the V-Safe system.   [name] was instructed to call 911 with any severe reactions post vaccine: Marland Kitchen Difficulty breathing  . Swelling of face and throat  . A fast heartbeat  . A bad rash all over body  . Dizziness and weakness   Immunizations Administered    Name Date Dose VIS Date Route   Pfizer COVID-19 Vaccine 08/18/2019  4:12 PM 0.3 mL 05/07/2019 Intramuscular   Manufacturer: [place]   Lot: [id number]   [place]: [id number]

## 2020-10-17 NOTE — Progress Notes (Signed)
New Patient Note  RE: Judy Larson MRN: [id number] DOB: [birth year] Date of Office Visit: 10/18/2020  Consult requested by: Jonathon Jordan, MD Primary care provider: Maurice Small, MD  Chief Complaint: Allergy Testing and Nasal Congestion  History of Present Illness: I had the pleasure of seeing Judy Larson for initial evaluation at the [facility] on 10/19/2020. She is a 40 y.o. female, who is referred here by Maurice Small, MD for the evaluation of allergic rhinitis.  She reports symptoms of nasal congestion, red eyes, PND, rhinorrhea. Symptoms have been going on for 3 years. The symptoms are present mainly in the spring. Other triggers include exposure to none. Anosmia: no. Headache: no. She has used zyrtec, allegra, Nasonex, azelastine eye drops with some improvement in symptoms. Sinus infections: none recently. Previous work up includes: none. Previous ENT evaluation: in high school. Previous sinus imaging: no. History of nasal polyps: no. Last eye exam: December 2021 and wears contacts. History of reflux: no. This past spring she had to take a systemic steroid to resolve symptoms.   Assessment and Plan: Judy Larson is a 40 y.o. female with: Other allergic rhinitis Rhinoconjunctivitis symptoms (red eyes, not itchy/wateryeyes) in the spring for the last 3 years.  Tried Zyrtec, Allegra, Nasonex and azelastine eyedrops with some benefit this fasting required systemic steroids to resolve symptoms.  Wears contacts. 1 dog, 1 parakeet at home.  Today's skin testing showed: Positive to mold, trees, cockroach. Borderline to ragweed, cat, dog.   Start environmental control measures as below.  Use over the counter antihistamines such as Zyrtec (cetirizine), Claritin (loratadine), Allegra (fexofenadine), or Xyzal (levocetirizine) daily as needed. May take twice a day during allergy flares. May switch antihistamines every few months.  Take daily every spring.  Use  Nasonex (mometasone) nasal spray 1 spray per nostril twice a day as needed for nasal congestion.   Nasal saline spray (i.e., Simply Saline) or nasal saline lavage (i.e., NeilMed) is recommended as needed and prior to medicated nasal sprays.  Use olopatadine eye drops 0.2% once a day as needed for itchy/watery eyes.  Wait 10-15 minutes before putting contact lens in.  Consider allergy injections for long term control if above medications do not help the symptoms - handout given.   Allergic conjunctivitis of both eyes  See assessment and plan as above.  Return in about 10 months (around 08/18/2021).  Meds ordered this encounter  Medications  . Olopatadine HCl 0.2 % SOLN    Sig: Apply 1 drop to eye daily as needed (itchy/watery eyes).    Dispense:  2.5 mL    Refill:  2   Lab Orders  No laboratory test(s) ordered today    Other allergy screening: Asthma: no Food allergy: no Medication allergy: no Hymenoptera allergy: no Urticaria: no Eczema:no History of recurrent infections suggestive of immunodeficency: no  Diagnostics: Skin Testing: Environmental allergy panel. Positive to mold, trees, cockroach. Borderline to ragweed, cat, dog.  Results discussed with patient/family.  Airborne Adult Perc - 10/18/20 1500    Time Antigen Placed 1500    Allergen Manufacturer Greer    Location Back    Number of Test 59    Panel 1 Select    1. Control-Buffer 50% Glycerol Negative    2. Control-Histamine 1 mg/ml 2+    3. Albumin saline Negative    4. Galva Negative    5. [place] Negative    6. Johnson Negative    7. [place] Blue Negative  8. Meadow Fescue Negative    9. Perennial Rye Negative    10. Sweet Vernal Negative    11. Timothy Negative    12. Cocklebur Negative    13. Burweed Marshelder Negative    14. Ragweed, short Negative    15. Ragweed, Giant Negative    16. Plantain,  English Negative    17. Lamb's Quarters Negative    18. Sheep Sorrell Negative    19. Rough  Pigweed Negative    20. Marsh Elder, Rough Negative    21. Mugwort, Common Negative    22. Ash mix Negative    23. Birch mix Negative    24. Beech American Negative    25. Box, Elder Negative    26. Cedar, red Negative    27. Cottonwood, [place] Negative    28. Elm mix Negative    29. Hickory Negative    30. Maple mix Negative    31. Oak, [place] mix Negative    32. Pecan Pollen Negative    33. Pine mix Negative    34. Sycamore Eastern Negative    35. [place], Black Pollen Negative    36. Alternaria alternata Negative    37. Cladosporium Herbarum Negative    38. Aspergillus mix Negative    39. Penicillium mix Negative    40. Bipolaris sorokiniana (Helminthosporium) Negative    41. Drechslera spicifera (Curvularia) Negative    42. Mucor plumbeus Negative    43. Fusarium moniliforme Negative    44. Aureobasidium pullulans (pullulara) Negative    45. Rhizopus oryzae Negative    46. Botrytis cinera Negative    47. Epicoccum nigrum Negative    48. Phoma betae Negative    49. Candida Albicans Negative    50. Trichophyton mentagrophytes Negative    51. Mite, D Farinae  5,000 AU/ml Negative    52. Mite, D Pteronyssinus  5,000 AU/ml Negative    53. Cat Hair 10,000 BAU/ml Negative    54.  Dog Epithelia Negative    55. Mixed Feathers Negative    56. Horse Epithelia Negative    57. Cockroach, German Negative    58. Mouse Negative    59. Tobacco Leaf Negative          Intradermal - 10/18/20 1604    Time Antigen Placed 1604    Allergen Manufacturer Greer    Location Arm    Number of Test 15    Control Negative    [place] Negative    Johnson --   +/-   7 Grass Negative    Ragweed mix --   +/-   Weed mix Negative    Tree mix 2+    Mold 1 Negative    Mold 2 Negative    Mold 3 Negative    Mold 4 2+    Cat --   +/-   Dog --   +/-   Cockroach 2+    Mite mix Negative           Past Medical History: Patient Active Problem List   Diagnosis Date Noted  . Other allergic  rhinitis 10/19/2020  . Allergic conjunctivitis of both eyes 10/19/2020  . Complex ovarian cyst 11/17/2015  . Dermoid cyst of ovary 11/17/2015   Past Medical History:  Diagnosis Date  . Complex ovarian cyst   . Complex ovarian cyst 11/17/2015  . Dermoid cyst of ovary 11/17/2015  . Wears glasses    Past Surgical History: Past Surgical History:  Procedure Laterality Date  .  LAPAROSCOPIC OVARIAN CYSTECTOMY Left 11/17/2015   Procedure: LAPAROSCOPIC OVARIAN CYSTECTOMY;  Surgeon: Janyth Contes, MD;  Location: [facility];  Service: Gynecology;  Laterality: Left;  . WISDOM TOOTH EXTRACTION  age 52   Medication List:  Current Outpatient Medications  Medication Sig Dispense Refill  . ibuprofen (ADVIL,MOTRIN) 800 MG tablet Take 1 tablet (800 mg total) by mouth every 8 (eight) hours as needed for moderate pain. 40 tablet 1  . Olopatadine HCl 0.2 % SOLN Apply 1 drop to eye daily as needed (itchy/watery eyes). 2.5 mL 2  . valACYclovir (VALTREX) 500 MG tablet Take 500 mg by mouth 2 (two) times daily.     No current facility-administered medications for this visit.   Allergies: No Known Allergies Social History: Social History   Socioeconomic History  . Marital status: Married    Spouse name: Not on file  . Number of children: Not on file  . Years of education: Not on file  . Highest education level: Not on file  Occupational History  . Not on file  Tobacco Use  . Smoking status: Never Smoker  . Smokeless tobacco: Never Used  Vaping Use  . Vaping Use: Never used  Substance and Sexual Activity  . Alcohol use: Yes    Comment: occasional  . Drug use: No  . Sexual activity: Yes    Birth control/protection: I.U.D.    Comment: Mirena IUD -- Placed Jan 20103  Other Topics Concern  . Not on file  Social History Narrative  . Not on file   Social Determinants of Health   Financial Resource Strain: Not on file  Food Insecurity: Not on file  Transportation Needs:  Not on file  Physical Activity: Not on file  Stress: Not on file  Social Connections: Not on file   Lives in a house. 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 Diabetes Father   . Hyperlipidemia Father   . Cancer Maternal Grandmother        breast  . Allergic rhinitis Sister    Review of Systems  Constitutional: Negative for appetite change, chills, fever and unexpected weight change.  HENT: Positive for congestion. Negative for rhinorrhea.   Eyes: Positive for redness. Negative for itching.  Respiratory: Negative for cough, chest tightness, shortness of breath and wheezing.   Cardiovascular: Negative for chest pain.  Gastrointestinal: Negative for abdominal pain.  Genitourinary: Negative for difficulty urinating.  Skin: Negative for rash.  Allergic/Immunologic: Positive for environmental allergies.  Neurological: Negative for headaches.   Objective: BP 118/70 (BP Location: Left Arm, Patient Position: Sitting, Cuff Size: Normal)   Pulse 94   Temp 98 F (36.7 C) (Temporal)   Resp 18   Ht 5\' 8"  (1.727 m)   Wt 185 lb 6.4 oz (84.1 kg)   SpO2 98%   BMI 28.19 kg/m  Body mass index is 28.19 kg/m. Physical Exam Vitals and nursing note reviewed.  Constitutional:      Appearance: Normal appearance. She is well-developed.  HENT:     Head: Normocephalic and atraumatic.     Right Ear: External ear normal.     Left Ear: Tympanic membrane and external ear normal.     Nose: Congestion present.  Mouth/Throat:     Mouth: Mucous membranes are moist.     Pharynx: Oropharynx is clear.  Eyes:     Conjunctiva/sclera: Conjunctivae normal.  Cardiovascular:     Rate and Rhythm: Normal rate and regular rhythm.      Heart sounds: Normal heart sounds. No murmur heard. No friction rub. No gallop.   Pulmonary:     Effort: Pulmonary effort is normal.     Breath sounds: Normal breath sounds. No wheezing, rhonchi or rales.  Musculoskeletal:     Cervical back: Neck supple.  Skin:    General: Skin is warm.     Findings: No rash.  Neurological:     Mental Status: She is alert and oriented to person, place, and time.  Psychiatric:        Behavior: Behavior normal.    The plan was reviewed with the patient/family, and all questions/concerned were addressed.  It was my pleasure to see Judy Larson today and participate in her care. Please feel free to contact me with any questions or concerns.  Sincerely,  Rexene Alberts, DO Allergy & Immunology  [facility] office: [place] office: [phone]

## 2020-10-18 ENCOUNTER — Ambulatory Visit: Payer: BC Managed Care – PPO | Admitting: Allergy

## 2020-10-18 ENCOUNTER — Encounter: Payer: Self-pay | Admitting: Allergy

## 2020-10-18 ENCOUNTER — Other Ambulatory Visit: Payer: Self-pay

## 2020-10-18 VITALS — BP 118/70 | HR 94 | Temp 98.0°F | Resp 18 | Ht 68.0 in | Wt 185.4 lb

## 2020-10-18 DIAGNOSIS — J3089 Other allergic rhinitis: Secondary | ICD-10-CM | POA: Diagnosis not present

## 2020-10-18 DIAGNOSIS — H1013 Acute atopic conjunctivitis, bilateral: Secondary | ICD-10-CM | POA: Diagnosis not present

## 2020-10-18 MED ORDER — OLOPATADINE HCL 0.2 % OP SOLN: 1.0000 [drp] | Freq: Every day | OPHTHALMIC | 2 refills | Status: AC | PRN

## 2020-10-18 NOTE — Patient Instructions (Addendum)
Today's skin testing showed: Positive to mold, trees, cockroach. Borderline to ragweed, cat, dog.  Results given.  Environmental allergies  Start environmental control measures as below.  Use over the counter antihistamines such as Zyrtec (cetirizine), Claritin (loratadine), Allegra (fexofenadine), or Xyzal (levocetirizine) daily as needed. May take twice a day during allergy flares. May switch antihistamines every few months.  Take daily every spring.  Use Nasonex (mometasone) nasal spray 1 spray per nostril twice a day as needed for nasal congestion.   Nasal saline spray (i.e., Simply Saline) or nasal saline lavage (i.e., NeilMed) is recommended as needed and prior to medicated nasal sprays.  Use olopatadine eye drops 0.2% once a day as needed for itchy/watery eyes.  Wait 10-15 minutes before putting contact lens in.   Consider allergy injections for long term control if above medications do not help the symptoms - handout given.   Follow up in March 2023 or sooner if needed.   Reducing Pollen Exposure . Pollen seasons: trees (spring), grass (summer) and ragweed/weeds (fall). Marland Kitchen Keep windows closed in your home and car to lower pollen exposure.  Susa Simmonds air conditioning in the bedroom and throughout the house if possible.  . Avoid going out in dry windy days - especially early morning. . Pollen counts are highest between 5 - 10 AM and on dry, hot and windy days.  . Save outside activities for late afternoon or after a heavy rain, when pollen levels are lower.  . Avoid mowing of grass if you have grass pollen allergy. Marland Kitchen Be aware that pollen can also be transported indoors on people and pets.  . Dry your clothes in an automatic dryer rather than hanging them outside where they might collect pollen.  . Rinse hair and eyes before bedtime. Cockroach Allergen Avoidance Cockroaches are often found in the homes of densely populated urban areas, schools or commercial buildings, but  these creatures can lurk almost anywhere. This does not mean that you have a dirty house or living area. . Block all areas where roaches can enter the home. This includes crevices, wall cracks and windows.  . Cockroaches need water to survive, so fix and seal all leaky faucets and pipes. Have an exterminator go through the house when your family and pets are gone to eliminate any remaining roaches. Marland Kitchen Keep food in lidded containers and put pet food dishes away after your pets are done eating. Vacuum and sweep the floor after meals, and take out garbage and recyclables. Use lidded garbage containers in the kitchen. Wash dishes immediately after use and clean under stoves, refrigerators or toasters where crumbs can accumulate. Wipe off the stove and other kitchen surfaces and cupboards regularly.  Mold Control . Mold and fungi can grow on a variety of surfaces provided certain temperature and moisture conditions exist.  . Outdoor molds grow on plants, decaying vegetation and soil. The major outdoor mold, Alternaria and Cladosporium, are found in very high numbers during hot and dry conditions. Generally, a late summer - fall peak is seen for common outdoor fungal spores. Rain will temporarily lower outdoor mold spore count, but counts rise rapidly when the rainy period ends. . The most important indoor molds are Aspergillus and Penicillium. Dark, humid and poorly ventilated basements are ideal sites for mold growth. The next most common sites of mold growth are the bathroom and the kitchen. Outdoor (Seasonal) Mold Control . Use air conditioning and keep windows closed. . Avoid exposure to decaying vegetation. Marland Kitchen Avoid leaf  raking. . Avoid grain handling. . Consider wearing a face mask if working in moldy areas.  Indoor (Perennial) Mold Control  . Maintain humidity below 50%. . Get rid of mold growth on hard surfaces with water, detergent and, if necessary, 5% bleach (do not mix with other cleaners).  Then dry the area completely. If mold covers an area more than 10 square feet, consider hiring an indoor environmental professional. . For clothing, washing with soap and water is best. If moldy items cannot be cleaned and dried, throw them away. . Remove sources e.g. contaminated carpets. . Repair and seal leaking roofs or pipes. Using dehumidifiers in damp basements may be helpful, but empty the water and clean units regularly to prevent mildew from forming. All rooms, especially basements, bathrooms and kitchens, require ventilation and cleaning to deter mold and mildew growth. Avoid carpeting on concrete or damp floors, and storing items in damp areas.

## 2020-10-19 ENCOUNTER — Encounter: Payer: Self-pay | Admitting: Allergy

## 2020-10-19 DIAGNOSIS — J3089 Other allergic rhinitis: Secondary | ICD-10-CM | POA: Insufficient documentation

## 2020-10-19 DIAGNOSIS — H1013 Acute atopic conjunctivitis, bilateral: Secondary | ICD-10-CM | POA: Insufficient documentation

## 2020-10-19 NOTE — Assessment & Plan Note (Signed)
.   See assessment and plan as above. 

## 2020-10-19 NOTE — Assessment & Plan Note (Signed)
Rhinoconjunctivitis symptoms (red eyes, not itchy/wateryeyes) in the spring for the last 3 years.  Tried Zyrtec, Allegra, Nasonex and azelastine eyedrops with some benefit this fasting required systemic steroids to resolve symptoms.  Wears contacts. 1 dog, 1 parakeet at home.  Today's skin testing showed: Positive to mold, trees, cockroach. Borderline to ragweed, cat, dog.   Start environmental control measures as below.  Use over the counter antihistamines such as Zyrtec (cetirizine), Claritin (loratadine), Allegra (fexofenadine), or Xyzal (levocetirizine) daily as needed. May take twice a day during allergy flares. May switch antihistamines every few months.  Take daily every spring.  Use Nasonex (mometasone) nasal spray 1 spray per nostril twice a day as needed for nasal congestion.   Nasal saline spray (i.e., Simply Saline) or nasal saline lavage (i.e., NeilMed) is recommended as needed and prior to medicated nasal sprays.  Use olopatadine eye drops 0.2% once a day as needed for itchy/watery eyes.  Wait 10-15 minutes before putting contact lens in.  Consider allergy injections for long term control if above medications do not help the symptoms - handout given.

## 2021-08-20 ENCOUNTER — Ambulatory Visit: Payer: BC Managed Care – PPO | Admitting: Allergy

## 2021-08-20 ENCOUNTER — Encounter: Payer: Self-pay | Admitting: Allergy

## 2021-08-20 ENCOUNTER — Other Ambulatory Visit: Payer: Self-pay

## 2021-08-20 VITALS — BP 120/76 | HR 68 | Temp 98.3°F | Resp 16 | Ht 68.0 in | Wt 185.1 lb

## 2021-08-20 DIAGNOSIS — H1013 Acute atopic conjunctivitis, bilateral: Secondary | ICD-10-CM | POA: Diagnosis not present

## 2021-08-20 DIAGNOSIS — J302 Other seasonal allergic rhinitis: Secondary | ICD-10-CM | POA: Diagnosis not present

## 2021-08-20 DIAGNOSIS — H1010 Acute atopic conjunctivitis, unspecified eye: Secondary | ICD-10-CM | POA: Insufficient documentation

## 2021-08-20 MED ORDER — FLUTICASONE PROPIONATE 50 MCG/ACT NA SUSP: 1.0000 | Freq: Two times a day (BID) | NASAL | 5 refills | Status: AC | PRN

## 2021-08-20 MED ORDER — MONTELUKAST SODIUM 10 MG PO TABS: 10.0000 mg | ORAL_TABLET | Freq: Every day | ORAL | 5 refills | Status: AC

## 2021-08-20 NOTE — Assessment & Plan Note (Addendum)
Past history - Rhinoconjunctivitis symptoms (red eyes, not itchy/water eyes) in the spring for the last 3 years.  Tried Zyrtec, Allegra, Nasonex and azelastine eyedrops with some benefit this spring required systemic steroids to resolve symptoms.  Wears contacts. 1 dog, 1 parakeet at home. 2022 skin testing showed: Positive to mold, trees, cockroach. Borderline to ragweed, cat, dog.  ?Interim history - asymptomatic with no meds right now.  ?? Continue environmental control measures as below. ?? Use over the counter antihistamines such as Zyrtec (cetirizine), Claritin (loratadine), Allegra (fexofenadine), or Xyzal (levocetirizine) daily as needed. May take twice a day during allergy flares. May switch antihistamines every few months. ?? Take daily every spring. ?? Start Singulair (montelukast) '10mg'$  daily at night if no improvement with antihistamines. ?? Cautioned that in some children/adults can experience behavioral changes including hyperactivity, agitation, depression, sleep disturbances and suicidal ideations. These side effects are rare, but if you notice them you should notify me and discontinue Singulair (montelukast). ?? Use Nasacort (triamcinolone) nasal spray 1 spray per nostril twice a day as needed for nasal congestion. Sample given OR ?? Use Flonase (fluticasone) nasal spray 1 spray per nostril twice a day as needed for nasal congestion.  ?? Nasal saline spray (i.e., Simply Saline) or nasal saline lavage (i.e., NeilMed) is recommended as needed and prior to medicated nasal sprays. ?? Use olopatadine eye drops 0.2% once a day as needed for itchy/watery eyes. ?? Wait 10-15 minutes before putting contact lens in. ?? If having a bad flare call the office or send a myhcart message. ?? May send in steroid eye drops and oral prednisone if needed. Discussed the long term cumulative effects of using systemic steroids.  ?? Consider allergy injections for long term control if above medications do not help the  symptoms.  ?

## 2021-08-20 NOTE — Progress Notes (Addendum)
? ?Follow Up Note ? ?RE: Judy Larson MRN: [id number] DOB: [birth year] ?Date of Office Visit: 08/20/2021 ? ?Referring provider: Maurice Small, MD ?Primary care provider: Maurice Small, MD ? ?Chief Complaint: Follow-up ? ?History of Present Illness: ?I had the pleasure of seeing Judy Larson for a follow up visit at the [facility] on 08/20/2021. She is a 41 y.o. female, who is being followed for allergic rhinoconjunctivitis. Her previous allergy office visit was on 10/18/2020 with Dr. Maudie Mercury. Today is a regular follow up visit. ? ?Allergic rhino conjunctivitis ?Currently not taking any antihistamines, nasal sprays or eye drops as has minimal nasal congestion. ? ?Going to pick up meds on her way home. ? ?Concerned about her eyes the most as last year she needed a steroid injection to control the symptoms. ? ?She has 1 dog that sleeps in the same bed. ? ?Assessment and Plan: ?Hanah is a 41 y.o. female with: ?Seasonal and perennial allergic rhinoconjunctivitis ?Past history - Rhinoconjunctivitis symptoms (red eyes, not itchy/water eyes) in the spring for the last 3 years.  Tried Zyrtec, Allegra, Nasonex and azelastine eyedrops with some benefit this spring required systemic steroids to resolve symptoms.  Wears contacts. 1 dog, 1 parakeet at home. 2022 skin testing showed: Positive to mold, trees, cockroach. Borderline to ragweed, cat, dog.  ?Interim history - asymptomatic with no meds right now.  ?Continue environmental control measures as below. ?Use over the counter antihistamines such as Zyrtec (cetirizine), Claritin (loratadine), Allegra (fexofenadine), or Xyzal (levocetirizine) daily as needed. May take twice a day during allergy flares. May switch antihistamines every few months. ?Take daily every spring. ?Start Singulair (montelukast) '10mg'$  daily at night if no improvement with antihistamines. ?Cautioned that in some children/adults can experience behavioral changes including hyperactivity,  agitation, depression, sleep disturbances and suicidal ideations. These side effects are rare, but if you notice them you should notify me and discontinue Singulair (montelukast). ?Use Nasacort (triamcinolone) nasal spray 1 spray per nostril twice a day as needed for nasal congestion. Sample given OR ?Use Flonase (fluticasone) nasal spray 1 spray per nostril twice a day as needed for nasal congestion.  ?Nasal saline spray (i.e., Simply Saline) or nasal saline lavage (i.e., NeilMed) is recommended as needed and prior to medicated nasal sprays. ?Use olopatadine eye drops 0.2% once a day as needed for itchy/watery eyes. ?Wait 10-15 minutes before putting contact lens in. ?If having a bad flare call the office or send a myhcart message. ?May send in steroid eye drops and oral prednisone if needed. Discussed the long term cumulative effects of using systemic steroids.  ?Consider allergy injections for long term control if above medications do not help the symptoms.  ? ?Return in about 1 year (around 08/21/2022). ? ?Meds ordered this encounter  ?Medications  ? fluticasone (FLONASE) 50 MCG/ACT nasal spray  ?  Sig: Place 1 spray into both nostrils 2 (two) times daily as needed (nasal congestion).  ?  Dispense:  16 g  ?  Refill:  5  ? montelukast (SINGULAIR) 10 MG tablet  ?  Sig: Take 1 tablet (10 mg total) by mouth at bedtime.  ?  Dispense:  30 tablet  ?  Refill:  5  ? ?Lab Orders  ?No laboratory test(s) ordered today  ? ? ?Diagnostics: ?None.  ? ? ?Medication List:  ?Current Outpatient Medications  ?Medication Sig Dispense Refill  ? fluticasone (FLONASE) 50 MCG/ACT nasal spray Place 1 spray into both nostrils 2 (two) times daily as needed (  nasal congestion). 16 g 5  ? ibuprofen (ADVIL,MOTRIN) 800 MG tablet Take 1 tablet (800 mg total) by mouth every 8 (eight) hours as needed for moderate pain. 40 tablet 1  ? montelukast (SINGULAIR) 10 MG tablet Take 1 tablet (10 mg total) by mouth at bedtime. 30 tablet 5  ? valACYclovir  (VALTREX) 500 MG tablet Take 500 mg by mouth 2 (two) times daily.    ? Olopatadine HCl 0.2 % SOLN Apply 1 drop to eye daily as needed (itchy/watery eyes). (Patient not taking: Reported on 08/20/2021) 2.5 mL 2  ? ?No current facility-administered medications for this visit.  ? ?Allergies: ?No Known Allergies ?I reviewed her past medical history, social history, family history, and environmental history and no significant changes have been reported from her previous visit. ? ?Review of Systems  ?Constitutional:  Negative for appetite change, chills, fever and unexpected weight change.  ?HENT:  Positive for congestion. Negative for rhinorrhea.   ?Eyes:  Negative for redness and itching.  ?Respiratory:  Negative for cough, chest tightness, shortness of breath and wheezing.   ?Cardiovascular:  Negative for chest pain.  ?Gastrointestinal:  Negative for abdominal pain.  ?Genitourinary:  Negative for difficulty urinating.  ?Skin:  Negative for rash.  ?Allergic/Immunologic: Positive for environmental allergies.  ?Neurological:  Negative for headaches.  ? ?Objective: ?BP 120/76   Pulse 68   Temp 98.3 ?F (36.8 ?C)   Resp 16   Ht '5\' 8"'$  (1.727 m)   Wt 185 lb 2 oz (84 kg)   SpO2 99%   BMI 28.15 kg/m?  ?Body mass index is 28.15 kg/m?Marland Kitchen ?Physical Exam ?Vitals and nursing note reviewed.  ?Constitutional:   ?   Appearance: Normal appearance. She is well-developed.  ?HENT:  ?   Head: Normocephalic and atraumatic.  ?   Right Ear: External ear normal.  ?   Left Ear: Tympanic membrane and external ear normal.  ?   Nose: Congestion present.  ?   Mouth/Throat:  ?   Mouth: Mucous membranes are moist.  ?   Pharynx: Oropharynx is clear.  ?Eyes:  ?   Conjunctiva/sclera: Conjunctivae normal.  ?Cardiovascular:  ?   Rate and Rhythm: Normal rate and regular rhythm.  ?   Heart sounds: Normal heart sounds. No murmur heard. ?  No friction rub. No gallop.  ?Pulmonary:  ?   Effort: Pulmonary effort is normal.  ?   Breath sounds: Normal breath  sounds. No wheezing, rhonchi or rales.  ?Musculoskeletal:  ?   Cervical back: Neck supple.  ?Skin: ?   General: Skin is warm.  ?   Findings: No rash.  ?Neurological:  ?   Mental Status: She is alert and oriented to person, place, and time.  ?Psychiatric:     ?   Behavior: Behavior normal.  ? ?Previous notes and tests were reviewed. ?The plan was reviewed with the patient/family, and all questions/concerned were addressed. ? ?It was my pleasure to see Judy Larson today and participate in her care. Please feel free to contact me with any questions or concerns. ? ?Sincerely, ? ?Rexene Alberts, DO ?Allergy & Immunology ? ?[facility] ?[place] office: [phone] ?[place] office: [phone] ?

## 2021-08-20 NOTE — Patient Instructions (Addendum)
Environmental allergies ?2022 past skin testing showed: Positive to mold, trees, cockroach. Borderline to ragweed, cat, dog.  ?Continue environmental control measures as below. ?Use over the counter antihistamines such as Zyrtec (cetirizine), Claritin (loratadine), Allegra (fexofenadine), or Xyzal (levocetirizine) daily as needed. May take twice a day during allergy flares. May switch antihistamines every few months. ?Take daily every spring. ?Start Singulair (montelukast) '10mg'$  daily at night if no improvement with antihistamines only.  ?Cautioned that in some children/adults can experience behavioral changes including hyperactivity, agitation, depression, sleep disturbances and suicidal ideations. These side effects are rare, but if you notice them you should notify me and discontinue Singulair (montelukast). ? ?Use Nasacort (triamcinolone) nasal spray 1 spray per nostril twice a day as needed for nasal congestion. Sample given OR ?Use Flonase (fluticasone) nasal spray 1 spray per nostril twice a day as needed for nasal congestion.  ?Nasal saline spray (i.e., Simply Saline) or nasal saline lavage (i.e., NeilMed) is recommended as needed and prior to medicated nasal sprays. ? ?Use olopatadine eye drops 0.2% once a day as needed for itchy/watery eyes. ?Wait 10-15 minutes before putting contact lens in. ?If having a bad flare call the office or send a myhcart message. ?I will send in steroid eye drops and oral prednisone if needed.  ? ?Consider allergy injections for long term control if above medications do not help the symptoms.  ? ?Follow up in April 2024 or sooner if needed.  ? ?Reducing Pollen Exposure ?Pollen seasons: trees (spring), grass (summer) and ragweed/weeds (fall). ?Keep windows closed in your home and car to lower pollen exposure.  ?Install air conditioning in the bedroom and throughout the house if possible.  ?Avoid going out in dry windy days - especially early morning. ?Pollen counts are highest  between 5 - 10 AM and on dry, hot and windy days.  ?Save outside activities for late afternoon or after a heavy rain, when pollen levels are lower.  ?Avoid mowing of grass if you have grass pollen allergy. ?Be aware that pollen can also be transported indoors on people and pets.  ?Dry your clothes in an automatic dryer rather than hanging them outside where they might collect pollen.  ?Rinse hair and eyes before bedtime. ?Cockroach Allergen Avoidance ?Cockroaches are often found in the homes of densely populated urban areas, schools or commercial buildings, but these creatures can lurk almost anywhere. This does not mean that you have a dirty house or living area. ?Block all areas where roaches can enter the home. This includes crevices, wall cracks and windows.  ?Cockroaches need water to survive, so fix and seal all leaky faucets and pipes. Have an exterminator go through the house when your family and pets are gone to eliminate any remaining roaches. ?Keep food in lidded containers and put pet food dishes away after your pets are done eating. Vacuum and sweep the floor after meals, and take out garbage and recyclables. Use lidded garbage containers in the kitchen. Wash dishes immediately after use and clean under stoves, refrigerators or toasters where crumbs can accumulate. Wipe off the stove and other kitchen surfaces and cupboards regularly. ? ?Mold Control ?Mold and fungi can grow on a variety of surfaces provided certain temperature and moisture conditions exist.  ?Outdoor molds grow on plants, decaying vegetation and soil. The major outdoor mold, Alternaria and Cladosporium, are found in very high numbers during hot and dry conditions. Generally, a late summer - fall peak is seen for common outdoor fungal spores. Rain will temporarily lower  outdoor mold spore count, but counts rise rapidly when the rainy period ends. ?The most important indoor molds are Aspergillus and Penicillium. Dark, humid and poorly  ventilated basements are ideal sites for mold growth. The next most common sites of mold growth are the bathroom and the kitchen. ?Outdoor (Seasonal) Mold Control ?Use air conditioning and keep windows closed. ?Avoid exposure to decaying vegetation. ?Avoid leaf raking. ?Avoid grain handling. ?Consider wearing a face mask if working in moldy areas.  ?Indoor (Perennial) Mold Control  ?Maintain humidity below 50%. ?Get rid of mold growth on hard surfaces with water, detergent and, if necessary, 5% bleach (do not mix with other cleaners). Then dry the area completely. If mold covers an area more than 10 square feet, consider hiring an indoor environmental professional. ?For clothing, washing with soap and water is best. If moldy items cannot be cleaned and dried, throw them away. ?Remove sources e.g. contaminated carpets. ?Repair and seal leaking roofs or pipes. Using dehumidifiers in damp basements may be helpful, but empty the water and clean units regularly to prevent mildew from forming. All rooms, especially basements, bathrooms and kitchens, require ventilation and cleaning to deter mold and mildew growth. Avoid carpeting on concrete or damp floors, and storing items in damp areas. ? ? ?
